# Patient Record
Sex: Male | Born: 1955 | Hispanic: Yes | Marital: Married | Smoking: Never smoker
Health system: Southern US, Community
[De-identification: ages and names within clinical notes are randomized; demographics above are authoritative.]

## PROBLEM LIST (undated history)

## (undated) DIAGNOSIS — Z889 Allergy status to unspecified drugs, medicaments and biological substances status: Secondary | ICD-10-CM

---

## 2014-05-19 ENCOUNTER — Encounter (HOSPITAL_COMMUNITY): Admission: EM | Disposition: A | Discharge status: Home Health | Payer: Self-pay | Source: Home / Self Care

## 2014-05-19 ENCOUNTER — Encounter (HOSPITAL_BASED_OUTPATIENT_CLINIC_OR_DEPARTMENT_OTHER): Payer: Self-pay | Admitting: *Deleted

## 2014-05-19 ENCOUNTER — Other Ambulatory Visit (HOSPITAL_COMMUNITY): Payer: Self-pay

## 2014-05-19 ENCOUNTER — Emergency Department (HOSPITAL_BASED_OUTPATIENT_CLINIC_OR_DEPARTMENT_OTHER): Payer: BLUE CROSS/BLUE SHIELD

## 2014-05-19 ENCOUNTER — Inpatient Hospital Stay (HOSPITAL_COMMUNITY): Payer: BLUE CROSS/BLUE SHIELD | Admitting: Anesthesiology

## 2014-05-19 ENCOUNTER — Inpatient Hospital Stay (HOSPITAL_BASED_OUTPATIENT_CLINIC_OR_DEPARTMENT_OTHER)
Admission: EM | Admit: 2014-05-19 | Discharge: 2014-05-23 | DRG: 329 | Disposition: A | Discharge status: Home Health | Payer: BLUE CROSS/BLUE SHIELD | Attending: General Surgery | Admitting: General Surgery

## 2014-05-19 DIAGNOSIS — D72829 Elevated white blood cell count, unspecified: Secondary | ICD-10-CM | POA: Diagnosis present

## 2014-05-19 DIAGNOSIS — K550 Acute vascular disorders of intestine: Secondary | ICD-10-CM | POA: Diagnosis present

## 2014-05-19 DIAGNOSIS — K420 Umbilical hernia with obstruction, without gangrene: Principal | ICD-10-CM | POA: Diagnosis present

## 2014-05-19 DIAGNOSIS — K56609 Unspecified intestinal obstruction, unspecified as to partial versus complete obstruction: Secondary | ICD-10-CM

## 2014-05-19 DIAGNOSIS — K9189 Other postprocedural complications and disorders of digestive system: Secondary | ICD-10-CM

## 2014-05-19 DIAGNOSIS — K567 Ileus, unspecified: Secondary | ICD-10-CM

## 2014-05-19 HISTORY — PX: UMBILICAL HERNIA REPAIR: SHX196

## 2014-05-19 LAB — URINALYSIS, ROUTINE W REFLEX MICROSCOPIC
Bilirubin Urine: NEGATIVE
GLUCOSE, UA: NEGATIVE mg/dL
HGB URINE DIPSTICK: NEGATIVE
KETONES UR: 15 mg/dL — AB
Leukocytes, UA: NEGATIVE
Nitrite: NEGATIVE
PH: 7 (ref 5.0–8.0)
Protein, ur: NEGATIVE mg/dL
Specific Gravity, Urine: 1.029 (ref 1.005–1.030)
Urobilinogen, UA: 1 mg/dL (ref 0.0–1.0)

## 2014-05-19 LAB — COMPREHENSIVE METABOLIC PANEL
ALBUMIN: 4.3 g/dL (ref 3.5–5.0)
ALK PHOS: 67 U/L (ref 38–126)
ALT: 24 U/L (ref 17–63)
AST: 14 U/L — ABNORMAL LOW (ref 15–41)
Anion gap: 11 (ref 5–15)
BUN: 15 mg/dL (ref 6–20)
CO2: 27 mmol/L (ref 22–32)
Calcium: 9.5 mg/dL (ref 8.9–10.3)
Chloride: 102 mmol/L (ref 101–111)
Creatinine, Ser: 0.59 mg/dL — ABNORMAL LOW (ref 0.61–1.24)
GFR calc Af Amer: >60 mL/min (ref >60)
GFR calc non Af Amer: >60 mL/min (ref >60)
Glucose, Bld: 174 mg/dL — ABNORMAL HIGH (ref 70–99)
POTASSIUM: 3.5 mmol/L (ref 3.5–5.1)
SODIUM: 140 mmol/L (ref 135–145)
TOTAL PROTEIN: 7.9 g/dL (ref 6.5–8.1)
Total Bilirubin: 0.9 mg/dL (ref 0.3–1.2)

## 2014-05-19 LAB — CBC WITH DIFFERENTIAL/PLATELET
Basophils Absolute: 0 10*3/uL (ref 0.0–0.1)
Basophils Relative: 0 % (ref 0–1)
EOS PCT: 0 % (ref 0–5)
Eosinophils Absolute: 0 10*3/uL (ref 0.0–0.7)
HEMATOCRIT: 47.4 % (ref 39.0–52.0)
HEMOGLOBIN: 16.6 g/dL (ref 13.0–17.0)
LYMPHS PCT: 7 % — AB (ref 12–46)
Lymphs Abs: 1 10*3/uL (ref 0.7–4.0)
MCH: 31.4 pg (ref 26.0–34.0)
MCHC: 35 g/dL (ref 30.0–36.0)
MCV: 89.6 fL (ref 78.0–100.0)
MONO ABS: 0.7 10*3/uL (ref 0.1–1.0)
Monocytes Relative: 5 % (ref 3–12)
Neutro Abs: 12.5 10*3/uL — ABNORMAL HIGH (ref 1.7–7.7)
Neutrophils Relative %: 88 % — ABNORMAL HIGH (ref 43–77)
Platelets: 310 10*3/uL (ref 150–400)
RBC: 5.29 MIL/uL (ref 4.22–5.81)
RDW: 12.4 % (ref 11.5–15.5)
WBC: 14.2 10*3/uL — ABNORMAL HIGH (ref 4.0–10.5)

## 2014-05-19 LAB — I-STAT CG4 LACTIC ACID, ED
LACTIC ACID, VENOUS: 1.24 mmol/L (ref 0.5–2.0)
LACTIC ACID, VENOUS: 1.16 mmol/L (ref 0.5–2.0)

## 2014-05-19 SURGERY — REPAIR, HERNIA, UMBILICAL, LAPAROSCOPIC
Anesthesia: General | Site: Abdomen

## 2014-05-19 MED ORDER — DEXAMETHASONE SODIUM PHOSPHATE 10 MG/ML IJ SOLN
INTRAMUSCULAR | Status: DC | PRN
  Administered 2014-05-19: 10 mg via INTRAVENOUS

## 2014-05-19 MED ORDER — MIDAZOLAM HCL 2 MG/2ML IJ SOLN
INTRAMUSCULAR | Status: AC
  Filled 2014-05-19: qty 2

## 2014-05-19 MED ORDER — PHENOL 1.4 % MT LIQD
1.0000 | OROMUCOSAL | Status: DC | PRN
  Administered 2014-05-20: 1 via OROMUCOSAL
  Filled 2014-05-19: qty 177

## 2014-05-19 MED ORDER — HYDROMORPHONE HCL 1 MG/ML IJ SOLN
1.0000 mg | Freq: Once | INTRAMUSCULAR | Status: AC
  Administered 2014-05-19: 1 mg via INTRAVENOUS
  Filled 2014-05-19: qty 1

## 2014-05-19 MED ORDER — FENTANYL CITRATE (PF) 100 MCG/2ML IJ SOLN: 25.0000 ug | INTRAMUSCULAR | Status: DC | PRN

## 2014-05-19 MED ORDER — CISATRACURIUM BESYLATE (PF) 10 MG/5ML IV SOLN
INTRAVENOUS | Status: DC | PRN
  Administered 2014-05-19: 2 mg via INTRAVENOUS
  Administered 2014-05-19: 8 mg via INTRAVENOUS

## 2014-05-19 MED ORDER — ACETAMINOPHEN 10 MG/ML IV SOLN
1000.0000 mg | Freq: Once | INTRAVENOUS | Status: AC
  Administered 2014-05-19: 1000 mg via INTRAVENOUS
  Filled 2014-05-19: qty 100

## 2014-05-19 MED ORDER — PROPOFOL 10 MG/ML IV BOLUS
INTRAVENOUS | Status: DC | PRN
  Administered 2014-05-19: 160 mg via INTRAVENOUS

## 2014-05-19 MED ORDER — NEOSTIGMINE METHYLSULFATE 10 MG/10ML IV SOLN
INTRAVENOUS | Status: DC | PRN
  Administered 2014-05-19: 3 mg via INTRAVENOUS

## 2014-05-19 MED ORDER — IOHEXOL 300 MG/ML  SOLN
100.0000 mL | Freq: Once | INTRAMUSCULAR | Status: AC | PRN
  Administered 2014-05-19: 100 mL via INTRAVENOUS

## 2014-05-19 MED ORDER — DEXAMETHASONE SODIUM PHOSPHATE 10 MG/ML IJ SOLN
INTRAMUSCULAR | Status: AC
  Filled 2014-05-19: qty 1

## 2014-05-19 MED ORDER — ENOXAPARIN SODIUM 40 MG/0.4ML ~~LOC~~ SOLN
40.0000 mg | SUBCUTANEOUS | Status: DC
  Administered 2014-05-20 – 2014-05-23 (×4): 40 mg via SUBCUTANEOUS
  Filled 2014-05-19 (×5): qty 0.4

## 2014-05-19 MED ORDER — BUPIVACAINE-EPINEPHRINE 0.25% -1:200000 IJ SOLN
INTRAMUSCULAR | Status: DC | PRN
  Administered 2014-05-19: 1 mL

## 2014-05-19 MED ORDER — GLYCOPYRROLATE 0.2 MG/ML IJ SOLN
INTRAMUSCULAR | Status: DC | PRN
  Administered 2014-05-19 (×2): 0.2 mg via INTRAVENOUS

## 2014-05-19 MED ORDER — MEPERIDINE HCL 50 MG/ML IJ SOLN: 6.2500 mg | INTRAMUSCULAR | Status: DC | PRN

## 2014-05-19 MED ORDER — CEFOTETAN DISODIUM 2 G IJ SOLR: 2.0000 g | Freq: Once | INTRAMUSCULAR | Status: DC

## 2014-05-19 MED ORDER — FENTANYL CITRATE (PF) 250 MCG/5ML IJ SOLN
INTRAMUSCULAR | Status: AC
  Filled 2014-05-19: qty 5

## 2014-05-19 MED ORDER — MIDAZOLAM HCL 5 MG/5ML IJ SOLN
INTRAMUSCULAR | Status: DC | PRN
  Administered 2014-05-19 (×2): 0.5 mg via INTRAVENOUS

## 2014-05-19 MED ORDER — CETYLPYRIDINIUM CHLORIDE 0.05 % MT LIQD
7.0000 mL | Freq: Two times a day (BID) | OROMUCOSAL | Status: DC
  Administered 2014-05-20 – 2014-05-22 (×4): 7 mL via OROMUCOSAL

## 2014-05-19 MED ORDER — HYDROMORPHONE HCL 1 MG/ML IJ SOLN: 0.5000 mg | INTRAMUSCULAR | Status: DC | PRN

## 2014-05-19 MED ORDER — SUCCINYLCHOLINE CHLORIDE 20 MG/ML IJ SOLN
INTRAMUSCULAR | Status: DC | PRN
  Administered 2014-05-19: 120 mg via INTRAVENOUS

## 2014-05-19 MED ORDER — BUPIVACAINE-EPINEPHRINE (PF) 0.25% -1:200000 IJ SOLN
INTRAMUSCULAR | Status: AC
Start: 2014-05-19 — End: 2014-05-19
  Filled 2014-05-19: qty 30

## 2014-05-19 MED ORDER — NEOSTIGMINE METHYLSULFATE 10 MG/10ML IV SOLN
INTRAVENOUS | Status: AC
  Filled 2014-05-19: qty 1

## 2014-05-19 MED ORDER — FENTANYL CITRATE (PF) 100 MCG/2ML IJ SOLN
INTRAMUSCULAR | Status: DC | PRN
  Administered 2014-05-19 (×2): 25 ug via INTRAVENOUS
  Administered 2014-05-19: 50 ug via INTRAVENOUS
  Administered 2014-05-19 (×2): 25 ug via INTRAVENOUS

## 2014-05-19 MED ORDER — HYDROMORPHONE HCL 1 MG/ML IJ SOLN
1.0000 mg | INTRAMUSCULAR | Status: DC | PRN
  Administered 2014-05-19 – 2014-05-21 (×6): 1 mg via INTRAVENOUS
  Filled 2014-05-19 (×6): qty 1

## 2014-05-19 MED ORDER — ONDANSETRON HCL 4 MG/2ML IJ SOLN
4.0000 mg | Freq: Once | INTRAMUSCULAR | Status: AC
  Administered 2014-05-19: 4 mg via INTRAVENOUS
  Filled 2014-05-19: qty 2

## 2014-05-19 MED ORDER — HYDROMORPHONE HCL 1 MG/ML IJ SOLN
0.5000 mg | Freq: Once | INTRAMUSCULAR | Status: AC
  Administered 2014-05-19: 0.5 mg via INTRAVENOUS
  Filled 2014-05-19: qty 1

## 2014-05-19 MED ORDER — ONDANSETRON HCL 4 MG/2ML IJ SOLN
4.0000 mg | Freq: Four times a day (QID) | INTRAMUSCULAR | Status: DC | PRN
  Administered 2014-05-19 (×2): 2 mg via INTRAVENOUS

## 2014-05-19 MED ORDER — CHLORHEXIDINE GLUCONATE 0.12 % MT SOLN
15.0000 mL | Freq: Two times a day (BID) | OROMUCOSAL | Status: DC
  Administered 2014-05-19 – 2014-05-22 (×5): 15 mL via OROMUCOSAL
  Filled 2014-05-19 (×6): qty 15

## 2014-05-19 MED ORDER — ONDANSETRON HCL 4 MG/2ML IJ SOLN
INTRAMUSCULAR | Status: AC
Start: 2014-05-19 — End: 2014-05-19
  Filled 2014-05-19: qty 2

## 2014-05-19 MED ORDER — LIDOCAINE HCL (CARDIAC) 20 MG/ML IV SOLN
INTRAVENOUS | Status: AC
  Filled 2014-05-19: qty 5

## 2014-05-19 MED ORDER — ONDANSETRON HCL 4 MG/2ML IJ SOLN: 4.0000 mg | Freq: Four times a day (QID) | INTRAMUSCULAR | Status: DC | PRN

## 2014-05-19 MED ORDER — CISATRACURIUM BESYLATE 20 MG/10ML IV SOLN
INTRAVENOUS | Status: AC
Start: 2014-05-19 — End: 2014-05-19
  Filled 2014-05-19: qty 10

## 2014-05-19 MED ORDER — HYDROMORPHONE HCL 1 MG/ML IJ SOLN
1.0000 mg | Freq: Once | INTRAMUSCULAR | Status: AC | PRN
  Administered 2014-05-19: 1 mg via INTRAVENOUS
  Filled 2014-05-19: qty 1

## 2014-05-19 MED ORDER — LIDOCAINE HCL (CARDIAC) 20 MG/ML IV SOLN
INTRAVENOUS | Status: DC | PRN
  Administered 2014-05-19: 75 mg via INTRAVENOUS

## 2014-05-19 MED ORDER — PROMETHAZINE HCL 25 MG/ML IJ SOLN: 6.2500 mg | INTRAMUSCULAR | Status: DC | PRN

## 2014-05-19 MED ORDER — POTASSIUM CHLORIDE IN NACL 20-0.9 MEQ/L-% IV SOLN
INTRAVENOUS | Status: DC
  Filled 2014-05-19 (×13): qty 1000

## 2014-05-19 MED ORDER — 0.9 % SODIUM CHLORIDE (POUR BTL) OPTIME
TOPICAL | Status: DC | PRN
  Administered 2014-05-19: 2000 mL

## 2014-05-19 MED ORDER — HYDROMORPHONE HCL 2 MG/ML IJ SOLN
INTRAMUSCULAR | Status: AC
  Filled 2014-05-19: qty 1

## 2014-05-19 MED ORDER — PROPOFOL 10 MG/ML IV BOLUS
INTRAVENOUS | Status: AC
Start: 2014-05-19 — End: 2014-05-19
  Filled 2014-05-19: qty 20

## 2014-05-19 MED ORDER — MORPHINE SULFATE 2 MG/ML IJ SOLN: 1.0000 mg | INTRAMUSCULAR | Status: DC | PRN

## 2014-05-19 MED ORDER — SODIUM CHLORIDE 0.9 % IV BOLUS (SEPSIS)
1000.0000 mL | Freq: Once | INTRAVENOUS | Status: AC
  Administered 2014-05-19: 1000 mL via INTRAVENOUS

## 2014-05-19 MED ORDER — GLYCOPYRROLATE 0.2 MG/ML IJ SOLN
INTRAMUSCULAR | Status: AC
  Filled 2014-05-19: qty 2

## 2014-05-19 MED ORDER — LACTATED RINGERS IV SOLN
INTRAVENOUS | Status: DC
  Administered 2014-05-19 (×2): via INTRAVENOUS
  Administered 2014-05-19: 1000 mL via INTRAVENOUS

## 2014-05-19 MED ORDER — CEFOTETAN DISODIUM-DEXTROSE 2-2.08 GM-% IV SOLR
INTRAVENOUS | Status: AC
  Filled 2014-05-19: qty 50

## 2014-05-19 MED ORDER — CEFOTETAN DISODIUM 2 G IJ SOLR
2.0000 g | INTRAMUSCULAR | Status: AC
  Administered 2014-05-19: 2 g via INTRAVENOUS

## 2014-05-19 MED ORDER — DEXTROSE-NACL 5-0.9 % IV SOLN
INTRAVENOUS | Status: DC
  Administered 2014-05-19 – 2014-05-20 (×3): 100 mL/h via INTRAVENOUS
  Administered 2014-05-20: 20:00:00 via INTRAVENOUS
  Administered 2014-05-21: 100 mL/h via INTRAVENOUS
  Administered 2014-05-21: 06:00:00 via INTRAVENOUS
  Administered 2014-05-22: 100 mL/h via INTRAVENOUS
  Administered 2014-05-22: 03:00:00 via INTRAVENOUS

## 2014-05-19 MED ORDER — GLYCOPYRROLATE 0.2 MG/ML IJ SOLN
INTRAMUSCULAR | Status: AC
  Filled 2014-05-19: qty 3

## 2014-05-19 SURGICAL SUPPLY — 62 items
APPLIER CLIP 5 13 M/L LIGAMAX5 (MISCELLANEOUS) ×4
APPLIER CLIP ROT 10 11.4 M/L (STAPLE) ×4
BAG URINE DRAINAGE (UROLOGICAL SUPPLIES) IMPLANT
BAG URO CATCHER STRL LF (DRAPE) IMPLANT
BLADE EXTENDED COATED 6.5IN (ELECTRODE) ×4 IMPLANT
BLADE HEX COATED 2.75 (ELECTRODE) ×4 IMPLANT
CABLE HIGH FREQUENCY MONO STRZ (ELECTRODE) ×4 IMPLANT
CATH FOLEY SILVER 30CC 28FR (CATHETERS) IMPLANT
CELLS DAT CNTRL 66122 CELL SVR (MISCELLANEOUS) IMPLANT
CLIP APPLIE 5 13 M/L LIGAMAX5 (MISCELLANEOUS) ×2 IMPLANT
CLIP APPLIE ROT 10 11.4 M/L (STAPLE) ×2 IMPLANT
DECANTER SPIKE VIAL GLASS SM (MISCELLANEOUS) ×4 IMPLANT
DRAIN CHANNEL 19F RND (DRAIN) ×4 IMPLANT
DRAPE LAPAROSCOPIC ABDOMINAL (DRAPES) ×4 IMPLANT
ELECT REM PT RETURN 9FT ADLT (ELECTROSURGICAL) ×4
ELECTRODE REM PT RTRN 9FT ADLT (ELECTROSURGICAL) ×2 IMPLANT
FILTER SMOKE EVAC LAPAROSHD (FILTER) IMPLANT
GAUZE SPONGE 4X4 12PLY STRL (GAUZE/BANDAGES/DRESSINGS) ×4 IMPLANT
GLOVE ECLIPSE 8.0 STRL XLNG CF (GLOVE) ×8 IMPLANT
GLOVE INDICATOR 8.0 STRL GRN (GLOVE) ×8 IMPLANT
GOWN STRL REUS W/TWL LRG LVL3 (GOWN DISPOSABLE) ×4 IMPLANT
GOWN STRL REUS W/TWL XL LVL3 (GOWN DISPOSABLE) ×8 IMPLANT
LEGGING LITHOTOMY PAIR STRL (DRAPES) IMPLANT
LIGASURE IMPACT 36 18CM CVD LR (INSTRUMENTS) IMPLANT
NS IRRIG 1000ML POUR BTL (IV SOLUTION) ×4 IMPLANT
PACK COLON (CUSTOM PROCEDURE TRAY) ×4 IMPLANT
PAD TELFA 2X3 NADH STRL (GAUZE/BANDAGES/DRESSINGS) ×12 IMPLANT
PAIN PUMP ON-Q 270MLX5ML 5IN (MISCELLANEOUS) IMPLANT
PUMP PAIN ON-Q (MISCELLANEOUS) ×4 IMPLANT
RELOAD PROXIMATE 75MM BLUE (ENDOMECHANICALS) ×8 IMPLANT
RTRCTR WOUND ALEXIS 18CM MED (MISCELLANEOUS)
SCISSORS LAP 5X35 DISP (ENDOMECHANICALS) ×4 IMPLANT
SEALER TISSUE G2 CVD JAW 35 (ENDOMECHANICALS) IMPLANT
SEALER TISSUE G2 CVD JAW 45CM (ENDOMECHANICALS)
SET IRRIG TUBING LAPAROSCOPIC (IRRIGATION / IRRIGATOR) ×4 IMPLANT
SHEARS HARMONIC ACE PLUS 36CM (ENDOMECHANICALS) IMPLANT
SOLUTION ANTI FOG 6CC (MISCELLANEOUS) ×4 IMPLANT
STAPLER PROXIMATE 75MM BLUE (STAPLE) ×4 IMPLANT
STAPLER VISISTAT 35W (STAPLE) ×4 IMPLANT
SUT PDS AB 1 CTX 36 (SUTURE) ×8 IMPLANT
SUT PDS AB 1 TP1 96 (SUTURE) IMPLANT
SUT PROLENE 2 0 KS (SUTURE) IMPLANT
SUT SILK 2 0 (SUTURE) ×2
SUT SILK 2 0 SH CR/8 (SUTURE) ×8 IMPLANT
SUT SILK 2-0 18XBRD TIE 12 (SUTURE) ×2 IMPLANT
SUT SILK 3 0 (SUTURE) ×2
SUT SILK 3 0 SH CR/8 (SUTURE) ×4 IMPLANT
SUT SILK 3-0 18XBRD TIE 12 (SUTURE) ×2 IMPLANT
SUT VIC AB 2-0 SH 18 (SUTURE) ×8 IMPLANT
SUT VICRYL 2 0 18  UND BR (SUTURE) ×4
SUT VICRYL 2 0 18 UND BR (SUTURE) ×4 IMPLANT
SYR 30ML LL (SYRINGE) IMPLANT
SYRINGE IRR TOOMEY STRL 70CC (SYRINGE) IMPLANT
SYS LAPSCP GELPORT 120MM (MISCELLANEOUS)
SYSTEM LAPSCP GELPORT 120MM (MISCELLANEOUS) IMPLANT
TRAY FOLEY W/METER SILVER 16FR (SET/KITS/TRAYS/PACK) ×4 IMPLANT
TROCAR CANNULA 5X100MM C0Q10 (TROCAR) ×4 IMPLANT
TROCAR KII 11MM C0R21THR OPT (TROCAR) ×4 IMPLANT
TROCAR KII 11MM THR (TROCAR) IMPLANT
TROCAR KII 5X100MM (TROCAR) ×8 IMPLANT
TUBING FILTER THERMOFLATOR (ELECTROSURGICAL) ×4 IMPLANT
YANKAUER SUCT BULB TIP NO VENT (SUCTIONS) ×4 IMPLANT

## 2014-05-19 NOTE — Transfer of Care (Signed)
Immediate Anesthesia Transfer of Care Note  Patient: Ernest [name]  Procedure(s) Performed: Procedure(s): DIAGNOSTIC LAPAROSCOPY, SMALL BOWEL RESECTION WITH PRIMARY INCARCERATED UMBILICAL HERNIA REPAIR (N/A)  Patient Location: PACU  Anesthesia Type:General  Level of Consciousness: awake, oriented, pateint uncooperative, lethargic and responds to stimulation  Airway & Oxygen Therapy: Patient Spontanous Breathing and Patient connected to face mask oxygen  Post-op Assessment: Report given to RN, Post -op Vital signs reviewed and stable and Patient moving all extremities  Post vital signs: Reviewed and stable  Last Vitals:  Filed Vitals:   05/19/14 1230  BP: 117/66  Pulse: 67  Temp:   Resp: 14    Complications: No apparent anesthesia complications

## 2014-05-19 NOTE — Anesthesia Postprocedure Evaluation (Signed)
  Anesthesia Post-op Note  Patient: Ernest [name]  Procedure(s) Performed: Procedure(s): DIAGNOSTIC LAPAROSCOPY, SMALL BOWEL RESECTION WITH PRIMARY INCARCERATED UMBILICAL HERNIA REPAIR (N/A)  Patient Location: PACU  Anesthesia Type:General  Level of Consciousness: awake, alert  and oriented  Airway and Oxygen Therapy: Patient Spontanous Breathing and Patient connected to nasal cannula oxygen  Post-op Pain: none  Post-op Assessment: Post-op Vital signs reviewed, Patient's Cardiovascular Status Stable, Respiratory Function Stable, Patent Airway and No signs of Nausea or vomiting  Post-op Vital Signs: Reviewed and stable  Last Vitals:  Filed Vitals:   05/19/14 1230  BP: 117/66  Pulse: 67  Temp: 36.7 C  Resp: 14    Complications: No apparent anesthesia complications

## 2014-05-19 NOTE — ED Notes (Signed)
abd pain around umbilical onset yesterday  Red and swollen

## 2014-05-19 NOTE — Anesthesia Preprocedure Evaluation (Addendum)
Anesthesia Evaluation  Patient identified by MRN, date of birth, ID band Patient awake    Reviewed: Allergy & Precautions, NPO status , Patient's Chart, lab work & pertinent test results, reviewed documented beta blocker date and time   Airway Mallampati: III   Neck ROM: Full    Dental  (+) Dental Advisory Given, Teeth Intact   Pulmonary neg pulmonary ROS,  breath sounds clear to auscultation        Cardiovascular negative cardio ROS  Rhythm:Regular     Neuro/Psych negative neurological ROS  negative psych ROS   GI/Hepatic Neg liver ROS, SBO incarcerated umbilical hernia   Endo/Other  negative endocrine ROS  Renal/GU      Musculoskeletal   Abdominal (+)  Abdomen: soft.    Peds  Hematology 14/47 WBC 14.2K   Anesthesia Other Findings   Reproductive/Obstetrics                           Anesthesia Physical Anesthesia Plan  ASA: II  Anesthesia Plan: General   Post-op Pain Management:    Induction: Rapid sequence, Intravenous and Cricoid pressure planned  Airway Management Planned: Oral ETT  Additional Equipment:   Intra-op Plan:   Post-operative Plan: Extubation in OR  Informed Consent: I have reviewed the patients History and Physical, chart, labs and discussed the procedure including the risks, benefits and alternatives for the proposed anesthesia with the patient or authorized representative who has indicated his/her understanding and acceptance.     Plan Discussed with:   Anesthesia Plan Comments:         Anesthesia Quick Evaluation

## 2014-05-19 NOTE — ED Notes (Signed)
Report given to Janett Billow RN at [facility] ED.

## 2014-05-19 NOTE — Op Note (Signed)
05/19/2014  12:12 PM  PATIENT:  Ernest [name]  59 y.o. male  PRE-OPERATIVE DIAGNOSIS:  incarcerated umbilical hernia possible small bowel obstruction   POST-OPERATIVE DIAGNOSIS:  Strangulate umbilical hernia repair with small bowel, necrotic bowel  PROCEDURE:  Procedure(s): DIAGNOSTIC LAPAROSCOPY, SMALL BOWEL RESECTION WITH PRIMARY INCARCERATED UMBILICAL HERNIA REPAIR (N/A)  SURGEON:  Surgeon(s) and Role:    * Ralene Ok, MD - Primary  ASSISTANTS: Servando Snare, RNFA   ANESTHESIA:   local and general  EBL:10cc   Total I/O In: 1000 [I.V.:1000] Out: 8786 [Urine:300; Other:700; Blood:25]  BLOOD ADMINISTERED:none  DRAINS: none   LOCAL MEDICATIONS USED:  BUPIVICAINE   SPECIMEN:  Source of Specimen:  Small bowel  DISPOSITION OF SPECIMEN:  PATHOLOGY  COUNTS:  YES  TOURNIQUET:  * No tourniquets in log *  DICTATION: .Dragon Dictation After the patient was consented he was taken back to the operating room and placed in the supine position with bilateral SCDs in place. The patient was prepped and draped in the usual sterile fashion. After appropriate antibiotic were confirmed, a timeout was called all facts verified.  A Veress needle technique was used to insufflate the abdomen to 15 mmHg in the left subcostal margin. Subsequent to this a 5 mm trocar and camera were then placed intra-abdominally. There is no injury to any intra-abdominal organs. At this time it was evident there was an incarcerated umbilical hernia consisting of small bowel. A subsequent 5 mm trocar was then placed in the left lower quadrant under direct visualization.  With gentle traction the incarcerated hernia was reduced. The small bowel that was within the hernia was dusky appearing. At this time secondary to the ischemic looking bowel I proceeded to create a midline incision at the umbilical hernia. This was incised using electrocautery used to maintain hemostasis. This time the portion of small bowel that was  ischemic was brought up through the wound.  Sterile towels were used to surround the incision site. At this time 75 GIA stapler blue, was used to transect the proximal and distal portion of the small bowel. The mesentery was ligated with 2-0 silk's.  At this time an enterotomy was made at each corner of the staple line. A 75 GIA stapler was used to create a common anastomosis and the standard fashion. The enterotomy was then closed using another firing of the 75 GIA stapler. The staple lines were offset. An apex stitch was placed using a 2-0 silk. One corner of the staple line appeared to be bleeding and this was sutured to imbricate the staple line with a 2-0 silk 1. At this time the mesentery was reapproximated using figure-of-eight stitches using 2-0 silk. The bowel was then placed back into the abdomen.  At this time our gloves were changed and new towels were placed around the umbilical incision site. Coker's were used to elevate the fascia. The apices of the fascia were then reapproximated using 0 Novafil sutures in a figure-of-eight fashion. At this time insufflation was begun and a laparoscope was again inserted into the abdomen. The remaining midportion of the fascia was then reapproximated using 0 Novafils via a Endo Close device and interrupted fashion. Omentum was brought over the anastomosis which appeared healthy. At this time the insufflation was evacuated. Trochars were removed. The incision site at the trochars were then reapproximated using a 4-0 Monocryl subcuticular fashion. The umbilical incision was left open and packed with saline soaked gauze. The wound was dressed with 4 x 4's and tape. The  trocar sites were dressed with Steri-Strips gauze and tape. The patient was awakened from general anesthesia and taken to the recovery room in stable condition.  Once this was done the bowel was placed back into the abdominal cavity.  PLAN OF CARE: Already admitted as inpatient  PATIENT  DISPOSITION:  PACU - hemodynamically stable.   Delay start of Pharmacological VTE agent (>24hrs) due to surgical blood loss or risk of bleeding: yes

## 2014-05-19 NOTE — ED Provider Notes (Signed)
CSN: [id number]     Arrival date & time 05/19/14  0447 History   First MD Initiated Contact with Patient 05/19/14 0500     Chief Complaint  Patient presents with  . Abdominal Pain     (Consider location/radiation/quality/duration/timing/severity/associated sxs/prior Treatment) HPI  This is a 59 year old male with no reported past medical history presents with abdominal pain. Patient reports onset of pain at approximately 3 PM yesterday. It occurred after eating. Abdomen has become progressively more swollen and pain has increased. Pain is currently 10 out of 10. It is sharp and nonradiating. He has not taken anything for the pain. Last normal bowel movement was yesterday. Patient denies any vomiting or diarrhea. Denies any fevers. No history of similar pain in the past.  History reviewed. No pertinent past medical history. History reviewed. No pertinent past surgical history. History reviewed. No pertinent family history. History  Substance Use Topics  . Smoking status: Never Smoker   . Smokeless tobacco: Not on file  . Alcohol Use: No    Review of Systems  Constitutional: Negative.  Negative for fever.  Respiratory: Negative.  Negative for chest tightness and shortness of breath.   Cardiovascular: Negative.  Negative for chest pain.  Gastrointestinal: Positive for abdominal pain. Negative for nausea, vomiting and diarrhea.  Genitourinary: Negative.  Negative for dysuria.  Musculoskeletal: Negative for back pain.  Skin: Positive for color change. Negative for rash.  All other systems reviewed and are negative.     Allergies  Review of patient's allergies indicates not on file.  Home Medications   Prior to Admission medications   Not on File   BP 141/82 mmHg  Pulse 78  Temp(Src) 98.5 F (36.9 C) (Oral)  Resp 20  Ht 5\' 7"  (1.702 m)  Wt 200 lb (90.719 kg)  BMI 31.32 kg/m2  SpO2 98% Physical Exam  Constitutional: He is oriented to person, place, and time. He appears  well-developed and well-nourished. No distress.  HENT:  Head: Normocephalic and atraumatic.  Cardiovascular: Normal rate, regular rhythm and normal heart sounds.   No murmur heard. Pulmonary/Chest: Effort normal and breath sounds normal. No respiratory distress. He has no wheezes.  Abdominal: Soft. There is tenderness. There is no rebound.  Hyperactive bowel sounds, tense umbilical hernia approximately 3 cm in diameter, overlying redness and tenderness to palpation, unreducible  Musculoskeletal: He exhibits no edema.  Neurological: He is alert and oriented to person, place, and time.  Skin: Skin is warm and dry.  Psychiatric: He has a normal mood and affect.  Nursing note and vitals reviewed.   ED Course  Procedures (including critical care time) Labs Review Labs Reviewed  URINALYSIS, ROUTINE W REFLEX MICROSCOPIC - Abnormal; Notable for the following:    Ketones, ur 15 (*)    All other components within normal limits  CBC WITH DIFFERENTIAL/PLATELET - Abnormal; Notable for the following:    WBC 14.2 (*)    Neutrophils Relative % 88 (*)    Neutro Abs 12.5 (*)    Lymphocytes Relative 7 (*)    All other components within normal limits  COMPREHENSIVE METABOLIC PANEL - Abnormal; Notable for the following:    Glucose, Bld 174 (*)    Creatinine, Ser 0.59 (*)    AST 14 (*)    All other components within normal limits  I-STAT CG4 LACTIC ACID, ED    Imaging Review Ct Abdomen Pelvis W Contrast  05/19/2014   CLINICAL DATA:  Abdominal pain, periumbilical.  A onset yesterday.  EXAM: CT ABDOMEN AND PELVIS WITH CONTRAST  TECHNIQUE: Multidetector CT imaging of the abdomen and pelvis was performed using the standard protocol following bolus administration of intravenous contrast.  CONTRAST:  122mL OMNIPAQUE IOHEXOL 300 MG/ML  SOLN  COMPARISON:  None.  FINDINGS: There is a small bowel obstruction in an umbilical hernia. The abnormal small bowel dilatation extends into the hernia. The small bowel  exiting the umbilical hernia is decompressed. There is no extraluminal air. There is no ascites.  There are normal appearances of the liver, spleen, pancreas, adrenals and kidneys. The abdominal aorta is normal in caliber. Colon is unremarkable.  There is no significant abnormality in the lower chest. Moderate lumbar degenerative disc changes are present, with a grade 1 retrolisthesis at L4-5. There also is severe lower lumbar facet arthritis from L4 through the sacrum.  IMPRESSION: *Small bowel obstruction within an umbilical hernia. *Incidentally noted lumbar degenerative disc and facet changes with grade 1 retrolisthesis at L4-5.   Electronically Signed   By: Andreas Newport M.D.   On: 05/19/2014 06:24     EKG Interpretation None      MDM   Final diagnoses:  Incarcerated umbilical hernia  SBO (small bowel obstruction)    Patient presents with abdominal pain. Denies obstructive symptoms. Vital signs stable. Exam notable for an incarcerated umbilical hernia. Attempts x 2 to reduce with adequate analgesia failed. Patient was made NPO. Given fluids. Basic labwork obtained. Lactic acid is normal. Patient does have leukocytosis with a left shift. CT scan shows umbilical hernia containing small bowel and causing obstruction. Clinically, patient does not have any obstructive symptoms. Discussed with Dr. Harlow Asa, [facility] surgery. Will transfer patient to [facility] ED. Patient will be evaluated by Dr. Rosendo Gros upon arrival.  Given that patient does not have any obstructive symptoms, NG tube has not been placed. This was discussed with general surgery and agreed upon.  Spoke with Dr. Lita Mains at [facility] who is expecting patient.    Merryl Hacker, MD 05/19/14 [phone]

## 2014-05-19 NOTE — Anesthesia Procedure Notes (Signed)
Procedure Name: Intubation Date/Time: 05/19/2014 10:50 AM Performed by: Ofilia Neas Pre-anesthesia Checklist: Patient identified, Emergency Drugs available, Suction available, Patient being monitored and Timeout performed Patient Re-evaluated:Patient Re-evaluated prior to inductionOxygen Delivery Method: Circle system utilized Preoxygenation: Pre-oxygenation with 100% oxygen Intubation Type: IV induction, Rapid sequence and Cricoid Pressure applied Laryngoscope Size: Mac and 4 Grade View: Grade III Tube type: Oral Tube size: 7.5 mm Number of attempts: 2 Airway Equipment and Method: Stylet Secured at: 22 cm Tube secured with: Tape Dental Injury: Teeth and Oropharynx as per pre-operative assessment  Difficulty Due To: Difficulty was anticipated, Difficult Airway- due to large tongue, Difficult Airway- due to reduced neck mobility, Difficult Airway- due to anterior larynx, Difficult Airway- due to limited oral opening and Difficult Airway- due to dentition Future Recommendations: Recommend- induction with short-acting agent, and alternative techniques readily available

## 2014-05-19 NOTE — ED Notes (Signed)
[facility] here to transport pt to [facility]. Care turned over to [facility] staff. Report given to [facility]

## 2014-05-19 NOTE — ED Provider Notes (Signed)
9:00 AM  Pt is a 59 y.o. male who was transferred from [facility] for concerns for incarcerated umbilical hernia. Currently hemodynamically stable. He has a nonsurgical abdomen.  I am unable to reduce patient's hernia. Surgery agreed to see the patient in the emergency department. Will contact surgery to have them evaluate patient in the ED and admit.  [place], DO 05/19/14 1521

## 2014-05-19 NOTE — ED Notes (Signed)
C/o abd pain onset yesterday around umbilical,  Red and swollen

## 2014-05-19 NOTE — ED Notes (Signed)
Ernest [name] OR transport pt to OR.

## 2014-05-19 NOTE — H&P (Signed)
Ernest [name] is an 59 y.o. male.   Chief Complaint: Umbilical pain HPI: 59 y/o with onset of redness and pain umbilical area, 14/78 pain on presentation to the ED at [facility].  LAST BM yesterday.  No nausea or vomiting, just pain.  He had no medical issues and no surgery in the past.  He was fine till yesterday. Has not eaten since about 1 PM yesterday.  Work up in [place] shows he is afebrile, VSS.  WBC is up 14.2 with left shift CT scan shows:   Small bowel obstruction within an umbilical hernia. Incidentally noted lumbar degenerative disc and facet changes with grade 1 retrolisthesis at L4-5.    History reviewed. No pertinent past medical history. No PE since he was a child  To see PCP for first time later this year.  History reviewed. No pertinent past surgical history.   He drank allot of beer in the past, none for 2 years Drugs: none Tobacco:  Never Married, works two jobs.  History reviewed. No pertinent family history. Social History:  reports that he has never smoked. He does not have any smokeless tobacco history on file. He reports that he does not drink alcohol or use illicit drugs.  Allergies: Not on File  Prior to Admission medications   Not on File     Results for orders placed or performed during the hospital encounter of 05/19/14 (from the past 48 hour(s))  Urinalysis, Routine w reflex microscopic     Status: Abnormal   Collection Time: 05/19/14  5:10 AM  Result Value Ref Range   Color, Urine YELLOW YELLOW   APPearance CLEAR CLEAR   Specific Gravity, Urine 1.029 1.005 - 1.030   pH 7.0 5.0 - 8.0   Glucose, UA NEGATIVE NEGATIVE mg/dL   Hgb urine dipstick NEGATIVE NEGATIVE   Bilirubin Urine NEGATIVE NEGATIVE   Ketones, ur 15 (A) NEGATIVE mg/dL   Protein, ur NEGATIVE NEGATIVE mg/dL   Urobilinogen, UA 1.0 0.0 - 1.0 mg/dL   Nitrite NEGATIVE NEGATIVE   Leukocytes, UA NEGATIVE NEGATIVE    Comment: MICROSCOPIC NOT DONE ON URINES WITH NEGATIVE PROTEIN, BLOOD, LEUKOCYTES,  NITRITE, OR GLUCOSE <1000 mg/dL.  CBC with Differential     Status: Abnormal   Collection Time: 05/19/14  5:20 AM  Result Value Ref Range   WBC 14.2 (H) 4.0 - 10.5 K/uL   RBC 5.29 4.22 - 5.81 MIL/uL   Hemoglobin 16.6 13.0 - 17.0 g/dL   HCT 47.4 39.0 - 52.0 %   MCV 89.6 78.0 - 100.0 fL   MCH 31.4 26.0 - 34.0 pg   MCHC 35.0 30.0 - 36.0 g/dL   RDW 12.4 11.5 - 15.5 %   Platelets 310 150 - 400 K/uL   Neutrophils Relative % 88 (H) 43 - 77 %   Neutro Abs 12.5 (H) 1.7 - 7.7 K/uL   Lymphocytes Relative 7 (L) 12 - 46 %   Lymphs Abs 1.0 0.7 - 4.0 K/uL   Monocytes Relative 5 3 - 12 %   Monocytes Absolute 0.7 0.1 - 1.0 K/uL   Eosinophils Relative 0 0 - 5 %   Eosinophils Absolute 0.0 0.0 - 0.7 K/uL   Basophils Relative 0 0 - 1 %   Basophils Absolute 0.0 0.0 - 0.1 K/uL  Comprehensive metabolic panel     Status: Abnormal   Collection Time: 05/19/14  5:20 AM  Result Value Ref Range   Sodium 140 135 - 145 mmol/L   Potassium 3.5 3.5 -  5.1 mmol/L   Chloride 102 101 - 111 mmol/L   CO2 27 22 - 32 mmol/L   Glucose, Bld 174 (H) 70 - 99 mg/dL   BUN 15 6 - 20 mg/dL   Creatinine, Ser 0.59 (L) 0.61 - 1.24 mg/dL   Calcium 9.5 8.9 - 10.3 mg/dL   Total Protein 7.9 6.5 - 8.1 g/dL   Albumin 4.3 3.5 - 5.0 g/dL   AST 14 (L) 15 - 41 U/L   ALT 24 17 - 63 U/L   Alkaline Phosphatase 67 38 - 126 U/L   Total Bilirubin 0.9 0.3 - 1.2 mg/dL   GFR calc non Af Amer >60 >60 mL/min   GFR calc Af Amer >60 >60 mL/min    Comment: (NOTE) The eGFR has been calculated using the CKD EPI equation. This calculation has not been validated in all clinical situations. eGFR's persistently <90 mL/min signify possible Chronic Kidney Disease.    Anion gap 11 5 - 15  I-Stat CG4 Lactic Acid, ED     Status: None   Collection Time: 05/19/14  5:28 AM  Result Value Ref Range   Lactic Acid, Venous 1.24 0.5 - 2.0 mmol/L   Ct Abdomen Pelvis W Contrast  05/19/2014   CLINICAL DATA:  Abdominal pain, periumbilical.  A onset yesterday.   EXAM: CT ABDOMEN AND PELVIS WITH CONTRAST  TECHNIQUE: Multidetector CT imaging of the abdomen and pelvis was performed using the standard protocol following bolus administration of intravenous contrast.  CONTRAST:  131m OMNIPAQUE IOHEXOL 300 MG/ML  SOLN  COMPARISON:  None.  FINDINGS: There is a small bowel obstruction in an umbilical hernia. The abnormal small bowel dilatation extends into the hernia. The small bowel exiting the umbilical hernia is decompressed. There is no extraluminal air. There is no ascites.  There are normal appearances of the liver, spleen, pancreas, adrenals and kidneys. The abdominal aorta is normal in caliber. Colon is unremarkable.  There is no significant abnormality in the lower chest. Moderate lumbar degenerative disc changes are present, with a grade 1 retrolisthesis at L4-5. There also is severe lower lumbar facet arthritis from L4 through the sacrum.  IMPRESSION: *Small bowel obstruction within an umbilical hernia. *Incidentally noted lumbar degenerative disc and facet changes with grade 1 retrolisthesis at L4-5.   Electronically Signed   By: DAndreas NewportM.D.   On: 05/19/2014 06:24    Review of Systems  Constitutional: Negative.   HENT: Negative.   Eyes: Negative.   Respiratory: Negative.   Cardiovascular: Negative.   Gastrointestinal: Positive for heartburn (occasional) and abdominal pain (10/10 on presentation to ED, better now with dilaudid). Negative for nausea, vomiting, diarrhea, constipation, blood in stool and melena.  Genitourinary: Negative.   Musculoskeletal: Negative.   Skin: Positive for rash (umbilicus started getting red yesterday). Negative for itching.  Neurological: Negative.   Endo/Heme/Allergies: Negative.   Psychiatric/Behavioral: Negative.     Blood pressure 125/72, pulse 72, temperature 97.7 F (36.5 C), temperature source Oral, resp. rate 18, height _0  (1.702 m), weight 90.719 kg (200 lb), SpO2 91 %. Physical Exam   Constitutional: He is oriented to person, place, and time. He appears well-developed and well-nourished. No distress.  HENT:  Head: Normocephalic and atraumatic.  Nose: Nose normal.  Eyes: Conjunctivae and EOM are normal. Right eye exhibits no discharge. Left eye exhibits no discharge. No scleral icterus.  Neck: Normal range of motion. Neck supple. No JVD present. No tracheal deviation present. No thyromegaly present.  Cardiovascular: Normal rate,  regular rhythm, normal heart sounds and intact distal pulses.   No murmur heard. Respiratory: Effort normal and breath sounds normal. No respiratory distress. He has no wheezes. He has no rales. He exhibits no tenderness.  GI: He exhibits no mass. There is tenderness. There is no rebound and no guarding.  Umbilicus 3.5 cm area of erythema, 6 cm area around umbilicus, hard and indurated.  Pain much better now that he has had pain med.  No bowel sounds  Musculoskeletal: He exhibits edema. He exhibits no tenderness.  Lymphadenopathy:    He has no cervical adenopathy.  Neurological: He is alert and oriented to person, place, and time. No cranial nerve deficit.  Skin: Skin is warm and dry. No rash noted. He is not diaphoretic. No erythema. No pallor.  Psychiatric: He has a normal mood and affect. His behavior is normal. Judgment and thought content normal.     Assessment/Plan 1. Incarcerated Umbilical hernia 2.  Remote history of ETOH use  Plan:  He is for surgery for repair of incarcerated umbilical hernia, possible small bowel resection.  Ernest [name] 05/19/2014, 9:15 AM

## 2014-05-19 NOTE — ED Notes (Signed)
Bed: EZ66 Expected date:  Expected time:  Means of arrival:  Comments: Incarcerated hernia from [facility]

## 2014-05-19 NOTE — ED Notes (Signed)
Pt going to [facility]. Rosana Fret at [facility] ED aware and will inform Occupational psychologist. Report given to Kateri Plummer at [facility] ED.

## 2014-05-20 ENCOUNTER — Inpatient Hospital Stay (HOSPITAL_COMMUNITY): Payer: BLUE CROSS/BLUE SHIELD

## 2014-05-20 ENCOUNTER — Encounter (HOSPITAL_COMMUNITY): Payer: Self-pay | Admitting: General Surgery

## 2014-05-20 MED ORDER — DEXTROSE 5 % IV SOLN
2.0000 g | Freq: Two times a day (BID) | INTRAVENOUS | Status: DC
  Administered 2014-05-20 – 2014-05-22 (×5): 2 g via INTRAVENOUS
  Filled 2014-05-20 (×5): qty 2

## 2014-05-20 MED ORDER — LIP MEDEX EX OINT
TOPICAL_OINTMENT | CUTANEOUS | Status: AC
  Administered 2014-05-20: 21:00:00
  Filled 2014-05-20: qty 7

## 2014-05-20 NOTE — Care Management Note (Signed)
Case Management Note  Patient Details  Name: Ernest [name] MRN: [id number] Date of Birth: [birth year]  Subjective/Objective:                  DIAGNOSTIC LAPAROSCOPY, SMALL BOWEL RESECTION WITH PRIMARY INCARCERATED UMBILICAL HERNIA REPAIR   Action/Plan: Discharge planning  Expected Discharge Date:   (unknown)               Expected Discharge Plan:   Home/self care  In-House Referral:     Discharge planning Services     Post Acute Care Choice:    Choice offered to:     DME Arranged:    DME Agency:     HH Arranged:    [place] Agency:     Status of Service: complete    Medicare Important Message Given: N/A   Date Medicare IM Given:    Medicare IM give by:    Date Additional Medicare IM Given:    Additional Medicare Important Message give by:     If discussed at [place] of Stay Meetings, dates discussed:    Additional Comments:  Guadalupe Maple, RN 05/20/2014, 2:41 PM

## 2014-05-20 NOTE — Progress Notes (Signed)
1 Day Post-Op  Subjective: No complaint, NG won't flush with air.  Either end.  I don't know if it's crimped or what but I am having it removed and replaced.  Objective: Vital signs in last 24 hours: Temp:  [97.6 F (36.4 C)-98.9 F (37.2 C)] 98.3 F (36.8 C) (05/05 1000) Pulse Rate:  [66-110] 97 (05/05 1000) Resp:  [13-16] 16 (05/05 1000) BP: (87-131)/(55-82) 107/68 mmHg (05/05 1000) SpO2:  [90 %-100 %] 90 % (05/05 1000) Last BM Date: 05/19/14 (PTA) 700 from NG Afebrile VSS No labs Intake/Output from previous day: 05/04 0701 - 05/05 0700 In: 3681.7 [I.V.:3681.7] Out: 1875 [Urine:1150; Blood:25] Intake/Output this shift: Total I/O In: 425 [I.V.:425] Out: 150 [Urine:150]  General appearance: alert, cooperative, no distress and sore Resp: clear to auscultation bilaterally GI: very sore, NG not working, incision OK, no BS, no flatus so far.  Lab Results:   Recent Labs  05/19/14 0520  WBC 14.2*  HGB 16.6  HCT 47.4  PLT 310    BMET  Recent Labs  05/19/14 0520  NA 140  K 3.5  CL 102  CO2 27  GLUCOSE 174*  BUN 15  CREATININE 0.59*  CALCIUM 9.5   PT/INR No results for input(s): LABPROT, INR in the last 72 hours.   Recent Labs Lab 05/19/14 0520  AST 14*  ALT 24  ALKPHOS 67  BILITOT 0.9  PROT 7.9  ALBUMIN 4.3     Lipase  No results found for: LIPASE   Studies/Results: Ct Abdomen Pelvis W Contrast  05/19/2014   CLINICAL DATA:  Abdominal pain, periumbilical.  A onset yesterday.  EXAM: CT ABDOMEN AND PELVIS WITH CONTRAST  TECHNIQUE: Multidetector CT imaging of the abdomen and pelvis was performed using the standard protocol following bolus administration of intravenous contrast.  CONTRAST:  164mL OMNIPAQUE IOHEXOL 300 MG/ML  SOLN  COMPARISON:  None.  FINDINGS: There is a small bowel obstruction in an umbilical hernia. The abnormal small bowel dilatation extends into the hernia. The small bowel exiting the umbilical hernia is decompressed. There is no  extraluminal air. There is no ascites.  There are normal appearances of the liver, spleen, pancreas, adrenals and kidneys. The abdominal aorta is normal in caliber. Colon is unremarkable.  There is no significant abnormality in the lower chest. Moderate lumbar degenerative disc changes are present, with a grade 1 retrolisthesis at L4-5. There also is severe lower lumbar facet arthritis from L4 through the sacrum.  IMPRESSION: *Small bowel obstruction within an umbilical hernia. *Incidentally noted lumbar degenerative disc and facet changes with grade 1 retrolisthesis at L4-5.   Electronically Signed   By: Andreas Newport M.D.   On: 05/19/2014 06:24    Medications: . antiseptic oral rinse  7 mL Mouth Rinse q12n4p  . cefoTEtan (CEFOTAN) IV  2 g Intravenous Once  . chlorhexidine  15 mL Mouth Rinse BID  . enoxaparin (LOVENOX) injection  40 mg Subcutaneous Q24H    Assessment/Plan Strangulate umbilical hernia repair with small bowel, necrotic bowel DIAGNOSTIC LAPAROSCOPY, SMALL BOWEL RESECTION WITH PRIMARY INCARCERATED UMBILICAL HERNIA REPAIR, 05/19/14, Dr. Ralene Ok Day 2 of Cefotetan  DVT:  SCD/Lovenox   Plan:  Remove non functioning NG, and replace, pull foley and start to mobilize.  Continue antibiotics    LOS: 1 day    Ernest [name] 05/20/2014

## 2014-05-21 LAB — BASIC METABOLIC PANEL
ANION GAP: 1 — AB (ref 5–15)
BUN: 14 mg/dL (ref 6–20)
CO2: 30 mmol/L (ref 22–32)
Calcium: 7.6 mg/dL — ABNORMAL LOW (ref 8.9–10.3)
Chloride: 110 mmol/L (ref 101–111)
Creatinine, Ser: 0.7 mg/dL (ref 0.61–1.24)
GFR calc Af Amer: >60 mL/min (ref >60)
GLUCOSE: 134 mg/dL — AB (ref 70–99)
POTASSIUM: 3.4 mmol/L — AB (ref 3.5–5.1)
Sodium: 141 mmol/L (ref 135–145)

## 2014-05-21 LAB — CBC
HCT: 27.6 % — ABNORMAL LOW (ref 39.0–52.0)
Hemoglobin: 9.1 g/dL — ABNORMAL LOW (ref 13.0–17.0)
MCH: 30.8 pg (ref 26.0–34.0)
MCHC: 33 g/dL (ref 30.0–36.0)
MCV: 93.6 fL (ref 78.0–100.0)
PLATELETS: 204 10*3/uL (ref 150–400)
RBC: 2.95 MIL/uL — AB (ref 4.22–5.81)
RDW: 13 % (ref 11.5–15.5)
WBC: 10.9 10*3/uL — AB (ref 4.0–10.5)

## 2014-05-21 MED ORDER — HYDROCODONE-ACETAMINOPHEN 5-325 MG PO TABS
1.0000 | ORAL_TABLET | ORAL | Status: DC | PRN
  Administered 2014-05-21 – 2014-05-23 (×5): 1 via ORAL
  Filled 2014-05-21: qty 1
  Filled 2014-05-21: qty 2
  Filled 2014-05-21 (×3): qty 1

## 2014-05-21 MED ORDER — ACETAMINOPHEN 325 MG PO TABS: 325.0000 mg | ORAL_TABLET | Freq: Four times a day (QID) | ORAL | Status: DC | PRN

## 2014-05-21 NOTE — Progress Notes (Signed)
Patient ID: Ernest [name], male   DOB: November 12, 1955, 59 y.o.   MRN: [id number]     [facility]      [address] [place], [place] [address]    Phone: [phone] FAX: [phone]     Subjective: Had a BM.  VSS.  Afebrile.   WBC 10.9k.  Walking.  Sore.   Objective:  Vital signs:  Filed Vitals:   05/20/14 2040 05/20/14 2041 05/20/14 2100 05/21/14 0615  BP:   114/75 113/58  Pulse:   98 97  Temp:   98.8 F (37.1 C) 97.5 F (36.4 C)  TempSrc:   Oral Oral  Resp:   16 16  Height:      Weight:      SpO2: 85% 93% 97% 98%    Last BM Date: 05/21/14  Intake/Output   Yesterday:  05/05 0701 - 05/06 0700 In: 2466.7 [I.V.:2366.7; IV Piggyback:100] Out: 2197 [JOITG:5498; Emesis/NG output:40] This shift:  Total I/O In: 400 [I.V.:400] Out: 10 [Emesis/NG output:10]   Physical Exam: General: Pt awake/alert/oriented x4 in no acute distress Chest: cta.  No chest wall pain w good excursion CV:  Pulses intact.  Regular rhythm MS: Normal AROM mjr joints.  No obvious deformity Abdomen: Soft.  Nondistended.  Umbilical wound is cleaned, repacked.   No evidence of peritonitis.  No incarcerated hernias.appropriately tender.  Ext:  SCDs BLE.  No mjr edema.  No cyanosis Skin: No petechiae / purpura   Problem List:   Active Problems:   Incarcerated umbilical hernia    Results:   Labs: Results for orders placed or performed during the hospital encounter of 05/19/14 (from the past 48 hour(s))  CBC     Status: Abnormal   Collection Time: 05/21/14  5:12 AM  Result Value Ref Range   WBC 10.9 (H) 4.0 - 10.5 K/uL   RBC 2.95 (L) 4.22 - 5.81 MIL/uL   Hemoglobin 9.1 (L) 13.0 - 17.0 g/dL   HCT 27.6 (L) 39.0 - 52.0 %   MCV 93.6 78.0 - 100.0 fL   MCH 30.8 26.0 - 34.0 pg   MCHC 33.0 30.0 - 36.0 g/dL   RDW 13.0 11.5 - 15.5 %   Platelets 204 150 - 400 K/uL  Basic metabolic panel     Status: Abnormal   Collection Time: 05/21/14  5:12 AM  Result  Value Ref Range   Sodium 141 135 - 145 mmol/L    Comment: REPEATED TO VERIFY   Potassium 3.4 (L) 3.5 - 5.1 mmol/L    Comment: REPEATED TO VERIFY   Chloride 110 101 - 111 mmol/L    Comment: REPEATED TO VERIFY   CO2 30 22 - 32 mmol/L    Comment: REPEATED TO VERIFY   Glucose, Bld 134 (H) 70 - 99 mg/dL   BUN 14 6 - 20 mg/dL   Creatinine, Ser 0.70 0.61 - 1.24 mg/dL   Calcium 7.6 (L) 8.9 - 10.3 mg/dL    Comment: REPEATED TO VERIFY   GFR calc non Af Amer >60 >60 mL/min   GFR calc Af Amer >60 >60 mL/min    Comment: (NOTE) The eGFR has been calculated using the CKD EPI equation. This calculation has not been validated in all clinical situations. eGFR's persistently <60 mL/min signify possible Chronic Kidney Disease.    Anion gap 1 (L) 5 - 15    Comment: REPEATED TO VERIFY    Imaging / Studies: Dg Abd 1 View  05/20/2014  CLINICAL DATA:  Postoperative ileus.  Nasogastric tube placement.  EXAM: ABDOMEN - 1 VIEW  COMPARISON:  None.  FINDINGS: Dilated loops of small bowel are present. Large amount of stool is present in the RIGHT colon.  Nasogastric tube is present with the tip in the gastric fundus. The stomach is collapsed.  On the more inferior film, there is no gas identified in the rectosigmoid.  IMPRESSION: 1. Nasogastric tube in the gastric fundus. 2. Mildly dilated loops of small bowel and large RIGHT-sided stool burden. In the postoperative setting, this is most compatible with ileus.   Electronically Signed   By: Dereck Ligas M.D.   On: 05/20/2014 17:04    Medications / Allergies:  Scheduled Meds: . antiseptic oral rinse  7 mL Mouth Rinse q12n4p  . cefoTEtan (CEFOTAN) IV  2 g Intravenous Q12H  . chlorhexidine  15 mL Mouth Rinse BID  . enoxaparin (LOVENOX) injection  40 mg Subcutaneous Q24H   Continuous Infusions: . 0.9 % NaCl with KCl 20 mEq / L    . dextrose 5 % and 0.9% NaCl 100 mL/hr at 05/21/14 0546   PRN Meds:.HYDROmorphone (DILAUDID) injection, HYDROmorphone (DILAUDID)  injection, ondansetron, phenol  Antibiotics: Anti-infectives    Start     Dose/Rate Route Frequency Ordered Stop   05/20/14 2200  cefoTEtan (CEFOTAN) 2 g in dextrose 5 % 50 mL IVPB  Status:  Discontinued     2 g 100 mL/hr over 30 Minutes Intravenous  Once 05/19/14 1326 05/20/14 1218   05/20/14 1300  cefoTEtan (CEFOTAN) 2 g in dextrose 5 % 50 mL IVPB     2 g 100 mL/hr over 30 Minutes Intravenous Every 12 hours 05/20/14 1218     05/19/14 1000  cefoTEtan (CEFOTAN) 2 g in dextrose 5 % 50 mL IVPB     2 g 100 mL/hr over 30 Minutes Intravenous On call to O.R. 05/19/14 0945 05/19/14 1030        Assessment/Plan POD#2 diagnostic laparoscopic, SBR with primary incarcerated umbilical hernia repair---Dr. Rosendo Gros -BID wet to dry dressing changes to open umbilical incision ID-cefotetan D#2, ?duration FEN-DC NGT and allow for fulls, transition to PO pain meds, IVF until PO is adequate  VTE prophylaxis-SCD/lovenox  Dispo-HH ordered for help with dressing changes.   Erby Pian, [facility] Surgery Pager [phone](7A-4:30P)   05/21/2014 11:26 AM

## 2014-05-22 NOTE — Progress Notes (Signed)
Patient ID: Ernest [name], male   DOB: December 25, 1955, 59 y.o.   MRN: [id number]     [facility]      [address] [place], [place] [address]    Phone: [phone] FAX: [phone]     Subjective: Had a BM.  VSS.  Afebrile. Walking.  Less sore.   Objective:  Vital signs:  Filed Vitals:   05/21/14 0615 05/21/14 1400 05/21/14 2154 05/22/14 0513  BP: 113/58 113/52 109/50 114/61  Pulse: 97 91 82 73  Temp: 97.5 F (36.4 C) 98.4 F (36.9 C) 98.9 F (37.2 C) 98.5 F (36.9 C)  TempSrc: Oral Oral Oral Oral  Resp: '16 15 16 16  ' Height:      Weight:      SpO2: 98% 92% 92% 95%    Last BM Date: 05/21/14  Intake/Output   Yesterday:  05/06 0701 - 05/07 0700 In: 2520 [P.O.:120; I.V.:2400] Out: 1310 [Urine:850; Emesis/NG output:460] This shift:      Physical Exam: General: Pt awake/alert/oriented x4 in no acute distress Abdomen: Soft.  Nondistended.  Umbilical wound is cleaned, repacked.   No evidence of peritonitis.  appropriately tender.  Ext:  SCDs BLE.  No mjr edema.  No cyanosis Skin: No petechiae / purpura   Problem List:   Active Problems:   Incarcerated umbilical hernia    Results:   Labs: Results for orders placed or performed during the hospital encounter of 05/19/14 (from the past 48 hour(s))  CBC     Status: Abnormal   Collection Time: 05/21/14  5:12 AM  Result Value Ref Range   WBC 10.9 (H) 4.0 - 10.5 K/uL   RBC 2.95 (L) 4.22 - 5.81 MIL/uL   Hemoglobin 9.1 (L) 13.0 - 17.0 g/dL   HCT 27.6 (L) 39.0 - 52.0 %   MCV 93.6 78.0 - 100.0 fL   MCH 30.8 26.0 - 34.0 pg   MCHC 33.0 30.0 - 36.0 g/dL   RDW 13.0 11.5 - 15.5 %   Platelets 204 150 - 400 K/uL  Basic metabolic panel     Status: Abnormal   Collection Time: 05/21/14  5:12 AM  Result Value Ref Range   Sodium 141 135 - 145 mmol/L    Comment: REPEATED TO VERIFY   Potassium 3.4 (L) 3.5 - 5.1 mmol/L    Comment: REPEATED TO VERIFY   Chloride 110 101 - 111 mmol/L     Comment: REPEATED TO VERIFY   CO2 30 22 - 32 mmol/L    Comment: REPEATED TO VERIFY   Glucose, Bld 134 (H) 70 - 99 mg/dL   BUN 14 6 - 20 mg/dL   Creatinine, Ser 0.70 0.61 - 1.24 mg/dL   Calcium 7.6 (L) 8.9 - 10.3 mg/dL    Comment: REPEATED TO VERIFY   GFR calc non Af Amer >60 >60 mL/min   GFR calc Af Amer >60 >60 mL/min    Comment: (NOTE) The eGFR has been calculated using the CKD EPI equation. This calculation has not been validated in all clinical situations. eGFR's persistently <60 mL/min signify possible Chronic Kidney Disease.    Anion gap 1 (L) 5 - 15    Comment: REPEATED TO VERIFY    Imaging / Studies: Dg Abd 1 View  05/20/2014   CLINICAL DATA:  Postoperative ileus.  Nasogastric tube placement.  EXAM: ABDOMEN - 1 VIEW  COMPARISON:  None.  FINDINGS: Dilated loops of small bowel are present. Large amount of stool is  present in the RIGHT colon.  Nasogastric tube is present with the tip in the gastric fundus. The stomach is collapsed.  On the more inferior film, there is no gas identified in the rectosigmoid.  IMPRESSION: 1. Nasogastric tube in the gastric fundus. 2. Mildly dilated loops of small bowel and large RIGHT-sided stool burden. In the postoperative setting, this is most compatible with ileus.   Electronically Signed   By: Dereck Ligas M.D.   On: 05/20/2014 17:04    Medications / Allergies:  Scheduled Meds: . antiseptic oral rinse  7 mL Mouth Rinse q12n4p  . cefoTEtan (CEFOTAN) IV  2 g Intravenous Q12H  . chlorhexidine  15 mL Mouth Rinse BID  . enoxaparin (LOVENOX) injection  40 mg Subcutaneous Q24H   Continuous Infusions:   PRN Meds:.acetaminophen, HYDROcodone-acetaminophen, HYDROmorphone (DILAUDID) injection, ondansetron, phenol  Antibiotics: Anti-infectives    Start     Dose/Rate Route Frequency Ordered Stop   05/20/14 2200  cefoTEtan (CEFOTAN) 2 g in dextrose 5 % 50 mL IVPB  Status:  Discontinued     2 g 100 mL/hr over 30 Minutes Intravenous  Once  05/19/14 1326 05/20/14 1218   05/20/14 1300  cefoTEtan (CEFOTAN) 2 g in dextrose 5 % 50 mL IVPB     2 g 100 mL/hr over 30 Minutes Intravenous Every 12 hours 05/20/14 1218     05/19/14 1000  cefoTEtan (CEFOTAN) 2 g in dextrose 5 % 50 mL IVPB     2 g 100 mL/hr over 30 Minutes Intravenous On call to O.R. 05/19/14 0945 05/19/14 1030        Assessment/Plan POD#3 diagnostic laparoscopic, SBR with primary incarcerated umbilical hernia repair---Dr. Rosendo Gros -BID wet to dry dressing changes to open umbilical incision ID-cefotetan D#3, no signs of infection, wound open.  Will d/c FEN-Pt tolerating fulls, having bowel function- advance to bland diet VTE prophylaxis-SCD/lovenox  Dispo-HH ordered for help with dressing changes.  Plan for d/c in [place], MD  [facility]orectal [facility] [place] [facility]    05/22/2014 12:34 PM

## 2014-05-23 MED ORDER — HYDROCODONE-ACETAMINOPHEN 5-325 MG PO TABS: 1.0000 | ORAL_TABLET | ORAL | Status: DC | PRN

## 2014-05-23 NOTE — Progress Notes (Signed)
4 Days Post-Op  Subjective: No complaints  Objective: Vital signs in last 24 hours: Temp:  [98 F (36.7 C)-98.7 F (37.1 C)] 98.7 F (37.1 C) (05/08 0500) Pulse Rate:  [78-81] 79 (05/08 0500) Resp:  [16] 16 (05/08 0500) BP: (118-124)/(62-78) 118/71 mmHg (05/08 0500) SpO2:  [95 %-98 %] 95 % (05/08 0500) Last BM Date: 05/21/14  Intake/Output from previous day: 05/07 0701 - 05/08 0700 In: 435 [I.V.:435] Out: 600 [Urine:600] Intake/Output this shift:    Resp: clear to auscultation bilaterally Cardio: regular rate and rhythm GI: soft, nontender. open wound is clean. having bm'[name]  Lab Results:   Recent Labs  05/21/14 0512  WBC 10.9*  HGB 9.1*  HCT 27.6*  PLT 204   BMET  Recent Labs  05/21/14 0512  NA 141  K 3.4*  CL 110  CO2 30  GLUCOSE 134*  BUN 14  CREATININE 0.70  CALCIUM 7.6*   PT/INR No results for input([name]): LABPROT, INR in the last 72 hours. ABG No results for input([name]): PHART, HCO3 in the last 72 hours.  Invalid input([name]): PCO2, PO2  Studies/Results: No results found.  Anti-infectives: Anti-infectives    Start     Dose/Rate Route Frequency Ordered Stop   05/20/14 2200  cefoTEtan (CEFOTAN) 2 g in dextrose 5 % 50 mL IVPB  Status:  Discontinued     2 g 100 mL/hr over 30 Minutes Intravenous  Once 05/19/14 1326 05/20/14 1218   05/20/14 1300  cefoTEtan (CEFOTAN) 2 g in dextrose 5 % 50 mL IVPB  Status:  Discontinued     2 g 100 mL/hr over 30 Minutes Intravenous Every 12 hours 05/20/14 1218 05/22/14 1237   05/19/14 1000  cefoTEtan (CEFOTAN) 2 g in dextrose 5 % 50 mL IVPB     2 g 100 mL/hr over 30 Minutes Intravenous On call to O.R. 05/19/14 0945 05/19/14 1030      Assessment/Plan: [name]/p Procedure([name]): DIAGNOSTIC LAPAROSCOPY, SMALL BOWEL RESECTION WITH PRIMARY INCARCERATED UMBILICAL HERNIA REPAIR (N/A) Advance diet Discharge  LOS: 4 days    Ernest [name],Ernest [name] 05/23/2014

## 2014-05-23 NOTE — Progress Notes (Signed)
Referral received to set up [facility] RN for dsg. Pt dc home prior to arranging [place]. Per Unit RN, she did show pt's wife how to do dressing changes prior to dc. Wife communicated to Unit RN that she did not want [facility] RN. Attempted call to speak to verify, left message on voice mail. Jonnie Finner RN CCM Case Mgmt phone [phone]

## 2014-05-23 NOTE — Progress Notes (Signed)
Wife able to demonstrate wet to dry dressing change  Correctly.

## 2014-05-25 NOTE — Care Management Note (Signed)
Case Management Note  Patient Details  Name: Ernest [name] MRN: [id number] Date of Birth: 12/10/1955  Subjective/Objective:                    Action/Plan: Lives at home with wife Ernest [name]  Expected Discharge Date:   (unknown)               Expected Discharge Plan:  [place]  In-House Referral:     Discharge planning Services  CM Consult  Post Acute Care Choice:  Home Health Choice offered to:  Spouse  DME Arranged:    DME Agency:     HH Arranged:  RN [place] Agency:  [place]  Status of Service:  Completed, signed off  Medicare Important Message Given:  No Date Medicare IM Given:    Medicare IM give by:    Date Additional Medicare IM Given:    Additional Medicare Important Message give by:     If discussed at [place] of Stay Meetings, dates discussed:    Additional Comments: NCM was able to reach pt and speak to his wife, Ernest [name]. States they would like [facility] RN to come out to look at surgery site.  Offered choice for [facility]. Wife agreeable to [facility] for [facility] RN. Contacted [facility] rep for [facility] RN. Jonnie Finner RN CCM Case Mgmt phone [phone] Erenest Rasher, RN 05/25/2014, 3:31 PM

## 2014-05-26 NOTE — Discharge Summary (Signed)
Physician Discharge Summary  Patient ID: Ernest [name] MRN: [id number] DOB/AGE: 59/26/1957 59 y.o.  Admit date: 05/19/2014 Discharge date: 05/23/2014  Admission Diagnoses:  Strangulate umbilical hernia repair with small bowel, necrotic bowel   Discharge Diagnoses:  Same  Active Problems:   Incarcerated umbilical hernia   PROCEDURES: DIAGNOSTIC LAPAROSCOPY, SMALL BOWEL RESECTION WITH PRIMARY INCARCERATED UMBILICAL HERNIA REPAIR, 05/19/14, Dr. Marchia Meiers Course:  59 y/o with onset of redness and pain umbilical area, 28/31 pain on presentation to the ED at [facility]. LAST BM yesterday. No nausea or vomiting, just pain. He had no medical issues and no surgery in the past. He was fine till yesterday. Has not eaten since about 1 PM yesterday. Work up in [place] shows he is afebrile, VSS. WBC is up 14.2 with left shift CT scan shows: Small bowel obstruction within an umbilical hernia.  Incidentally noted lumbar degenerative disc and facet changes with grade 1 retrolisthesis at L4-5. Pt was seen in the ED and taken to the OR by Dr. Rosendo Gros.  He tolerated the procedure well, and was returned to the floor.  Post op he had some ileus, but was otherwise did very well.  His diet was advanced and by his 4th post op day he was taking a soft diet and ready for discharge.    Condition on d/c:  Improved  Disposition: 06-Home-Health Care Svc  Discharge Instructions    Call MD for:  difficulty breathing, headache or visual disturbances    Complete by:  As directed      Call MD for:  extreme fatigue    Complete by:  As directed      Call MD for:  hives    Complete by:  As directed      Call MD for:  persistant dizziness or light-headedness    Complete by:  As directed      Call MD for:  persistant nausea and vomiting    Complete by:  As directed      Call MD for:  redness, tenderness, or signs of infection (pain, swelling, redness, odor or green/yellow discharge around incision site)     Complete by:  As directed      Call MD for:  severe uncontrolled pain    Complete by:  As directed      Call MD for:  temperature >100.4    Complete by:  As directed      Diet - low sodium heart healthy    Complete by:  As directed      Discharge instructions    Complete by:  As directed   Remove packing and shower daily. Wash wound gently. Repack wound and cover after shower. Diet as tolerated     Increase activity slowly    Complete by:  As directed             Medication List    TAKE these medications        diphenoxylate-atropine 2.5-0.025 MG per tablet  Commonly known as:  LOMOTIL  Take 2 tablets by mouth 4 (four) times daily as needed for diarrhea or loose stools.     fexofenadine-pseudoephedrine 180-240 MG per 24 hr tablet  Commonly known as:  ALLEGRA-D 24  Take 1 tablet by mouth daily as needed (allerigies).     HYDROcodone-acetaminophen 5-325 MG per tablet  Commonly known as:  NORCO/VICODIN  Take 1-2 tablets by mouth every 4 (four) hours as needed for moderate pain or severe pain.  Follow-up Information    Follow up with Reyes Ivan, MD In 2 weeks.   Specialty:  General Surgery   Contact information:   [address] [place] [address] [phone]       Signed: Earnstine Regal 05/26/2014, 10:22 AM

## 2014-10-06 ENCOUNTER — Other Ambulatory Visit: Payer: Self-pay | Admitting: Gastroenterology

## 2014-12-06 ENCOUNTER — Encounter (HOSPITAL_COMMUNITY): Payer: Self-pay | Admitting: *Deleted

## 2014-12-16 NOTE — Anesthesia Preprocedure Evaluation (Addendum)
Anesthesia Evaluation  Patient identified by MRN, date of birth, ID band Patient awake    Reviewed: Allergy & Precautions, NPO status , Patient's Chart, lab work & pertinent test results  Airway Mallampati: II   Neck ROM: Full    Dental  (+) Dental Advisory Given, Teeth Intact   Pulmonary neg pulmonary ROS,    breath sounds clear to auscultation       Cardiovascular negative cardio ROS   Rhythm:Regular     Neuro/Psych negative neurological ROS  negative psych ROS   GI/Hepatic Neg liver ROS, SP small bowl resection 2nd to incarcerated [facility]   Endo/Other  negative endocrine ROS  Renal/GU negative Renal ROS  negative genitourinary   Musculoskeletal negative musculoskeletal ROS (+)   Abdominal (+)  Abdomen: soft.    Peds negative pediatric ROS (+)  Hematology   Anesthesia Other Findings   Reproductive/Obstetrics negative OB ROS                            Anesthesia Physical Anesthesia Plan  ASA: II  Anesthesia Plan: MAC   Post-op Pain Management:    Induction: Intravenous  Airway Management Planned: Nasal Cannula  Additional Equipment:   Intra-op Plan:   Post-operative Plan:   Informed Consent: I have reviewed the patients History and Physical, chart, labs and discussed the procedure including the risks, benefits and alternatives for the proposed anesthesia with the patient or authorized representative who has indicated his/her understanding and acceptance.     Plan Discussed with:   Anesthesia Plan Comments:         Anesthesia Quick Evaluation

## 2014-12-21 ENCOUNTER — Ambulatory Visit (HOSPITAL_COMMUNITY): Payer: BLUE CROSS/BLUE SHIELD | Admitting: Anesthesiology

## 2014-12-21 ENCOUNTER — Encounter (HOSPITAL_COMMUNITY): Payer: Self-pay | Admitting: *Deleted

## 2014-12-21 ENCOUNTER — Encounter (HOSPITAL_COMMUNITY)
Admission: RE | Disposition: A | Discharge status: Home / Self Care | Payer: Self-pay | Source: Ambulatory Visit | Attending: Gastroenterology

## 2014-12-21 ENCOUNTER — Ambulatory Visit (HOSPITAL_COMMUNITY)
Admission: RE | Admit: 2014-12-21 | Discharge: 2014-12-21 | Disposition: A | Discharge status: Home / Self Care | Payer: BLUE CROSS/BLUE SHIELD | Source: Ambulatory Visit | Attending: Gastroenterology | Admitting: Gastroenterology

## 2014-12-21 DIAGNOSIS — Z1211 Encounter for screening for malignant neoplasm of colon: Secondary | ICD-10-CM | POA: Insufficient documentation

## 2014-12-21 DIAGNOSIS — E7800 Pure hypercholesterolemia, unspecified: Secondary | ICD-10-CM | POA: Diagnosis not present

## 2014-12-21 DIAGNOSIS — D124 Benign neoplasm of descending colon: Secondary | ICD-10-CM | POA: Insufficient documentation

## 2014-12-21 HISTORY — DX: Allergy status to unspecified drugs, medicaments and biological substances: Z88.9

## 2014-12-21 HISTORY — PX: COLONOSCOPY WITH PROPOFOL: SHX5780

## 2014-12-21 SURGERY — COLONOSCOPY WITH PROPOFOL
Anesthesia: Monitor Anesthesia Care

## 2014-12-21 MED ORDER — PROPOFOL 500 MG/50ML IV EMUL
INTRAVENOUS | Status: DC | PRN
  Administered 2014-12-21 (×3): 30 mg via INTRAVENOUS

## 2014-12-21 MED ORDER — PROPOFOL 500 MG/50ML IV EMUL
INTRAVENOUS | Status: DC | PRN
  Administered 2014-12-21: 100 ug/kg/min via INTRAVENOUS

## 2014-12-21 MED ORDER — MEPERIDINE HCL 100 MG/ML IJ SOLN: 6.2500 mg | INTRAMUSCULAR | Status: DC | PRN

## 2014-12-21 MED ORDER — FENTANYL CITRATE (PF) 100 MCG/2ML IJ SOLN: 25.0000 ug | INTRAMUSCULAR | Status: DC | PRN

## 2014-12-21 MED ORDER — PROPOFOL 10 MG/ML IV BOLUS
INTRAVENOUS | Status: AC
  Filled 2014-12-21: qty 40

## 2014-12-21 MED ORDER — PROMETHAZINE HCL 25 MG/ML IJ SOLN: 6.2500 mg | INTRAMUSCULAR | Status: DC | PRN

## 2014-12-21 MED ORDER — SODIUM CHLORIDE 0.9 % IV SOLN: INTRAVENOUS | Status: DC

## 2014-12-21 MED ORDER — LACTATED RINGERS IV SOLN
INTRAVENOUS | Status: DC
  Administered 2014-12-21: 1000 mL via INTRAVENOUS

## 2014-12-21 SURGICAL SUPPLY — 21 items

## 2014-12-21 NOTE — Anesthesia Postprocedure Evaluation (Signed)
Anesthesia Post Note  Patient: Ernest [name]  Procedure(s) Performed: Procedure(s) (LRB): COLONOSCOPY WITH PROPOFOL (N/A)  Patient location during evaluation: PACU Anesthesia Type: MAC Level of consciousness: awake and alert Pain management: pain level controlled Vital Signs Assessment: post-procedure vital signs reviewed and stable Respiratory status: spontaneous breathing, nonlabored ventilation, respiratory function stable and patient connected to nasal cannula oxygen Cardiovascular status: stable and blood pressure returned to baseline Anesthetic complications: no    Last Vitals:  Filed Vitals:   12/21/14 0707 12/21/14 0835  BP: 144/77   Pulse: 65 62  Temp: 36.6 C   Resp: 18 10    Last Pain: There were no vitals filed for this visit.               Carol Theys

## 2014-12-21 NOTE — Op Note (Signed)
Procedure: Screening colonoscopy  Endoscopist: Earle Gell  Premedication: Propofol administered by anesthesia  Procedure: The patient was placed in the left lateral decubitus position. Anal inspection and digital rectal exam were normal. The Pentax pediatric colonoscope was introduced into the rectum and advanced to the cecum. A normal-appearing appendiceal orifice was identified. A normal-appearing ileocecal valve was intubated and the terminal ileum inspected. Colonic preparation for the exam today was good. Withdrawal time was 11 minutes  Rectum. Normal. Retroflexed view of the distal rectum was normal  Sigmoid colon. Normal  Descending colon. From the distal descending colon at 55 cm from the anal verge, a 6 mm pedunculated polyp was removed with the electrocautery snare and a Resolution Endo Clip was applied to the polypectomy site to prevent bleeding.  Splenic flexure. Normal  Transverse colon. Normal  Hepatic flexure. Normal  Ascending colon. Normal  Cecum and ileocecal valve. Normal  Terminal ileum. Normal  Assessment: A small polyp was removed from the distal descending colon. Otherwise normal colonoscopy.  Recommendations: If the colon polyp returns adenomatous pathologically, schedule repeat colonoscopy in 5 years

## 2014-12-21 NOTE — Transfer of Care (Signed)
Immediate Anesthesia Transfer of Care Note  Patient: Ernest [name]  Procedure(s) Performed: Procedure(s): COLONOSCOPY WITH PROPOFOL (N/A)  Patient Location: PACU  Anesthesia Type:MAC  Level of Consciousness: sedated, patient cooperative and responds to stimulation  Airway & Oxygen Therapy: Patient Spontanous Breathing and Patient connected to face mask oxygen  Post-op Assessment: Report given to RN and Post -op Vital signs reviewed and stable  Post vital signs: Reviewed and stable  Last Vitals:  Filed Vitals:   12/21/14 0707  BP: 144/77  Pulse: 65  Temp: 36.6 C  Resp: 18    Complications: No apparent anesthesia complications

## 2014-12-21 NOTE — Discharge Instructions (Signed)

## 2014-12-21 NOTE — H&P (Signed)
  Procedure: Baseline screening colonoscopy. No family history of colon cancer.  History: The patient is a 59 year old male born [birth year]. He is scheduled to undergo his first screening colonoscopy with polypectomy to prevent colon cancer.  Medication allergies: None  Past medical history: Hypercholesterolemia. Incarcerated umbilical hernia repair in May 2016  Exam: The patient is alert and lying comfortably on the endoscopy stretcher. Abdomen is soft and nontender to palpation. Lungs are clear to auscultation. Cardiac exam reveals a regular rhythm.  Plan: Proceed with baseline screening colonoscopy

## 2014-12-22 ENCOUNTER — Encounter (HOSPITAL_COMMUNITY): Payer: Self-pay | Admitting: Gastroenterology

## 2016-09-05 IMAGING — CT CT ABD-PELV W/ CM
2 of 5 series · 16 of 46 positions shown, 18 images · IV contrast (APPLIED)
Comparison: None.

CLINICAL DATA: Abdominal pain, periumbilical.  A onset yesterday.

EXAM:
CT ABDOMEN AND PELVIS WITH CONTRAST
TECHNIQUE: Multidetector CT imaging of the abdomen and pelvis was performed
using the standard protocol following bolus administration of
intravenous contrast.
CONTRAST:  100mL OMNIPAQUE IOHEXOL 300 MG/ML  SOLN

[Series 2: abd/pelvis 5.0 b31f · axial · 0.78mm/px · z∈[+747,+1212]mm · 13 of 107 slices shown, 15 images]
[im 7/107  soft-tissue]
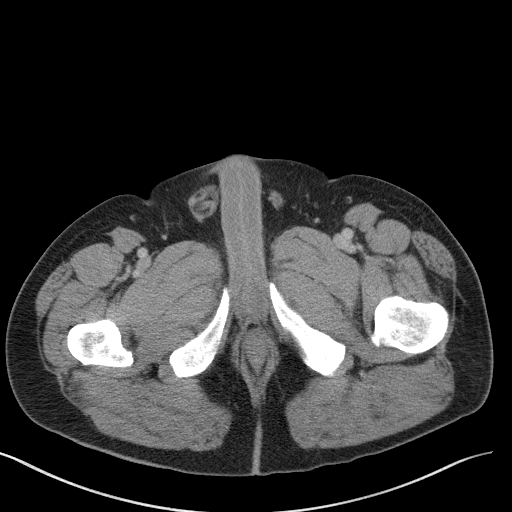
[im 7/107  bone]
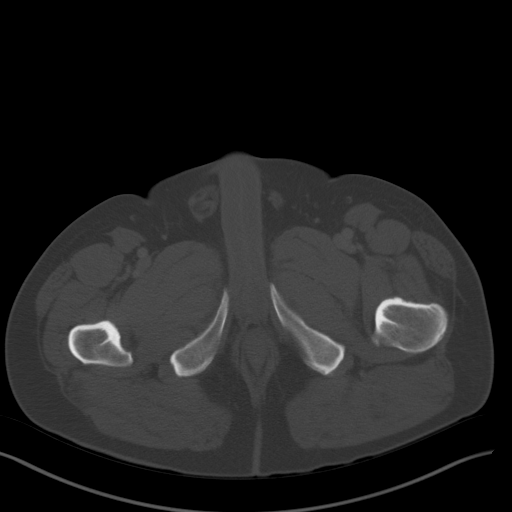
[im 13/107  soft-tissue]
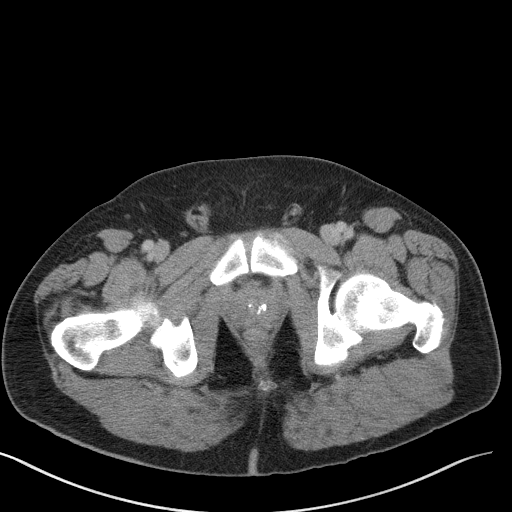
[im 25/107  soft-tissue]
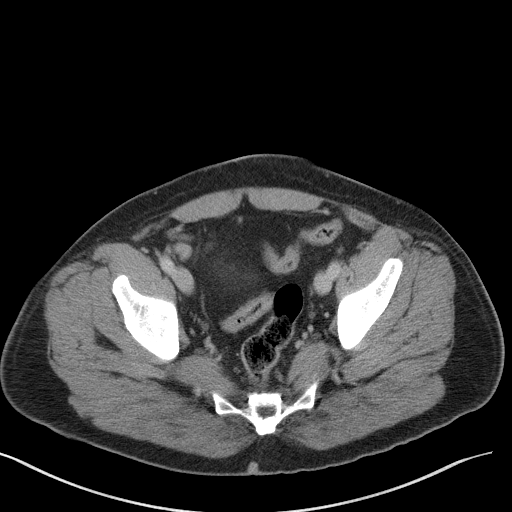
[im 32/107  soft-tissue]
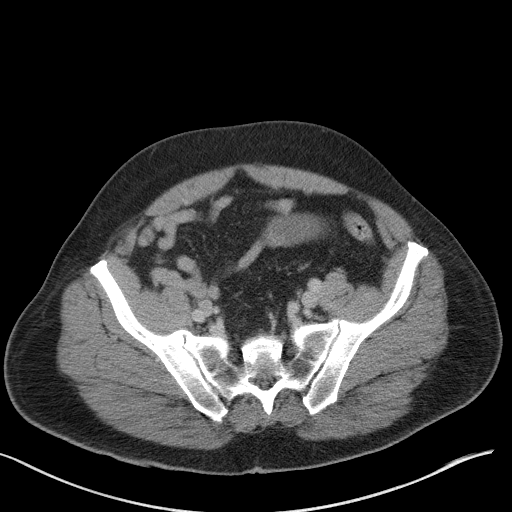
[im 38/107  soft-tissue]
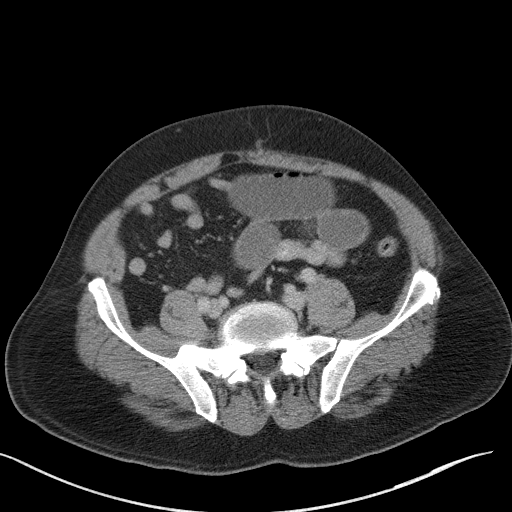
[im 44/107  soft-tissue]
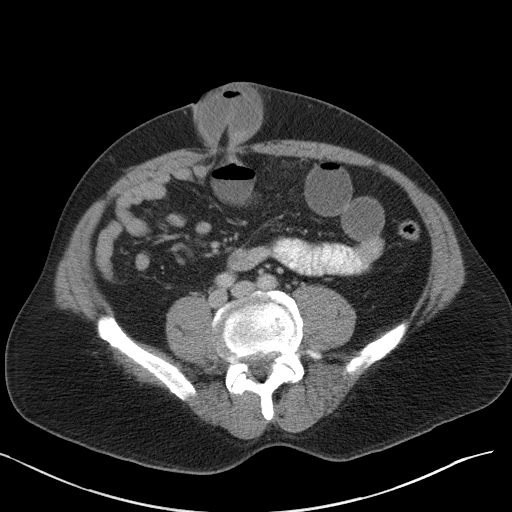
[im 57/107  soft-tissue]
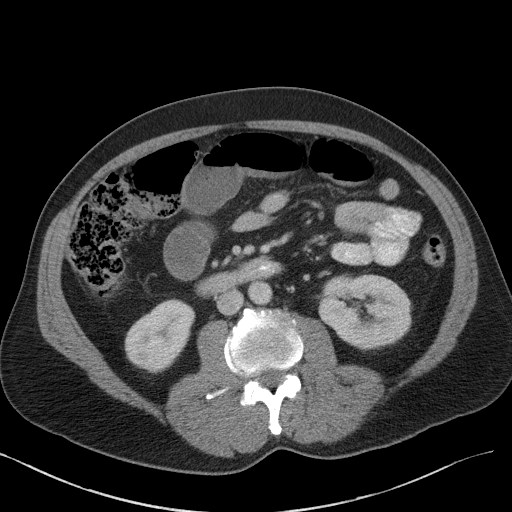
[im 63/107  soft-tissue]
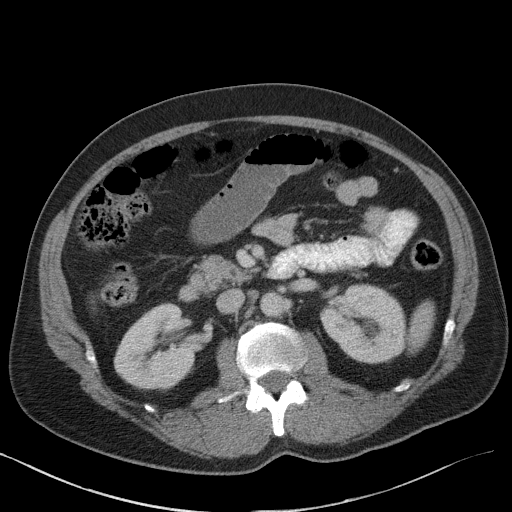
[im 69/107  soft-tissue]
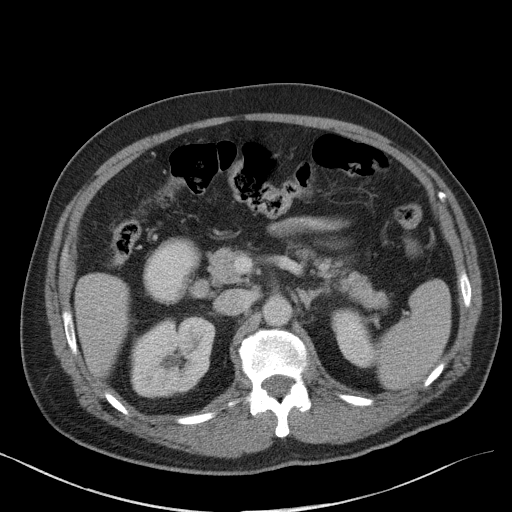
[im 69/107  bone]
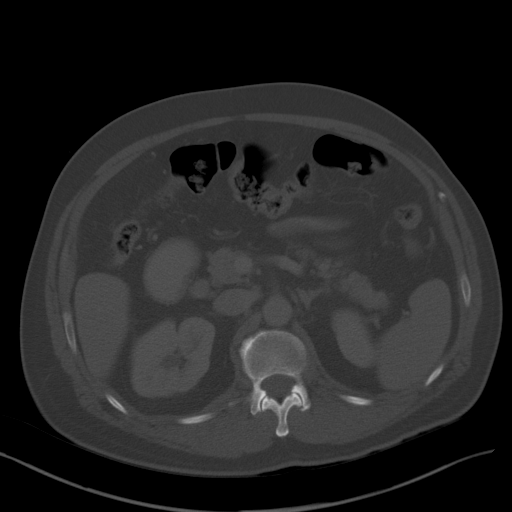
[im 75/107  soft-tissue]
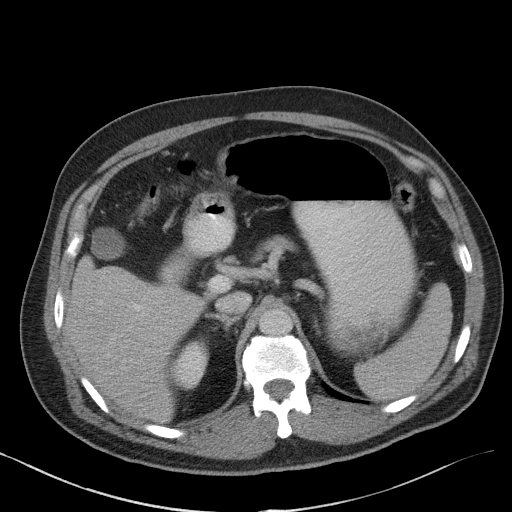
[im 82/107  soft-tissue]
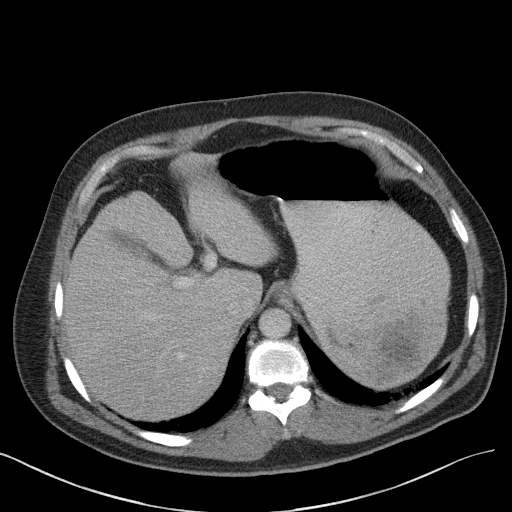
[im 94/107  soft-tissue]
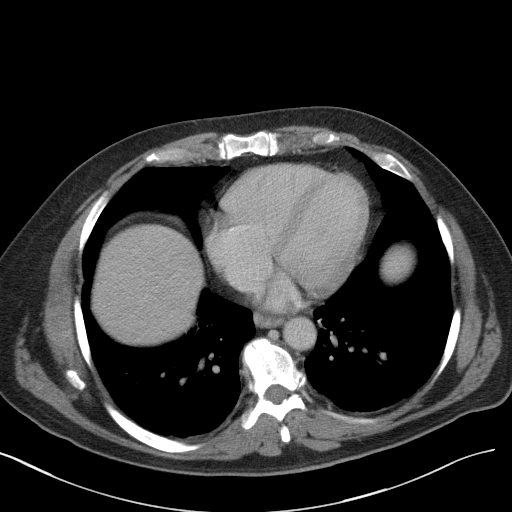
[im 100/107  soft-tissue]
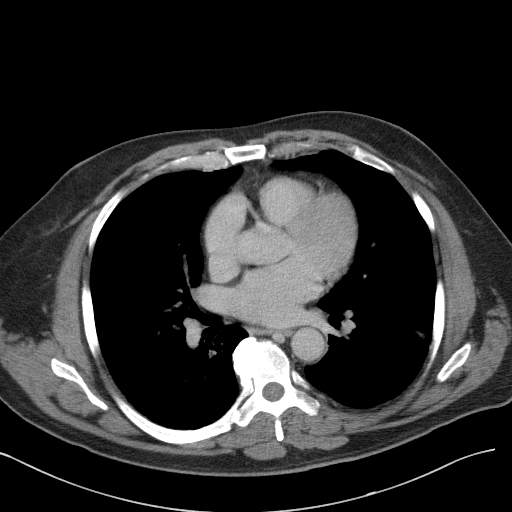

[Series 5: abd/pelvis 3.0 coronal · coronal · 0.83mm/px · 3 of 98 slices shown]
[im 33/98  soft-tissue]
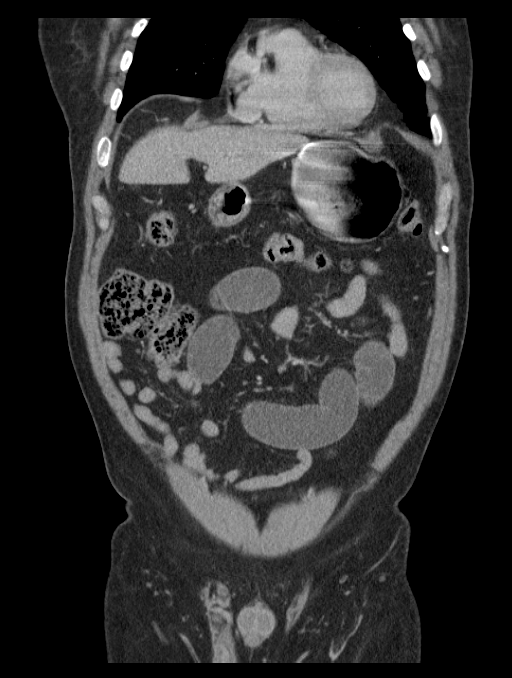
[im 44/98  soft-tissue]
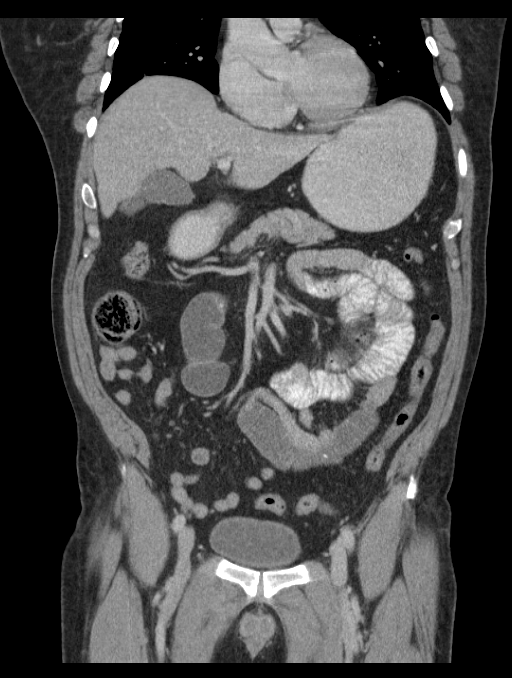
[im 54/98  soft-tissue]
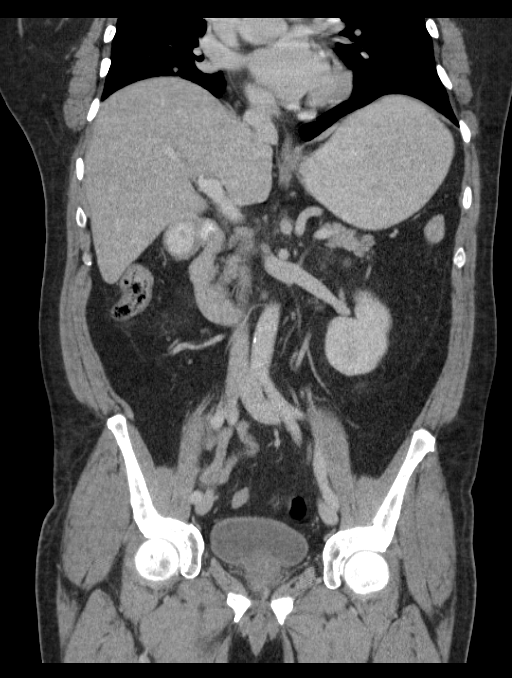

[16 of 46 positions shown; findings below may reference images not displayed]

FINDINGS: There is a small bowel obstruction in an umbilical hernia. The
abnormal small bowel dilatation extends into the hernia. The small
bowel exiting the umbilical hernia is decompressed. There is no
extraluminal air. There is no ascites.

There are normal appearances of the liver, spleen, pancreas,
adrenals and kidneys. The abdominal aorta is normal in caliber.
Colon is unremarkable.

There is no significant abnormality in the lower chest. Moderate
lumbar degenerative disc changes are present, with a grade 1
retrolisthesis at L4-5. There also is severe lower lumbar facet
arthritis from L4 through the sacrum.
IMPRESSION: *Small bowel obstruction within an umbilical hernia.
*Incidentally noted lumbar degenerative disc and facet changes with
grade 1 retrolisthesis at L4-5.

## 2016-09-06 IMAGING — DX DG ABDOMEN 1V
2 series · 2 of 2 positions shown · non-contrast
Comparison: None.

CLINICAL DATA: Postoperative ileus.  Nasogastric tube placement.

EXAM:
ABDOMEN - 1 VIEW

[abdomen kub (1 of 2)]
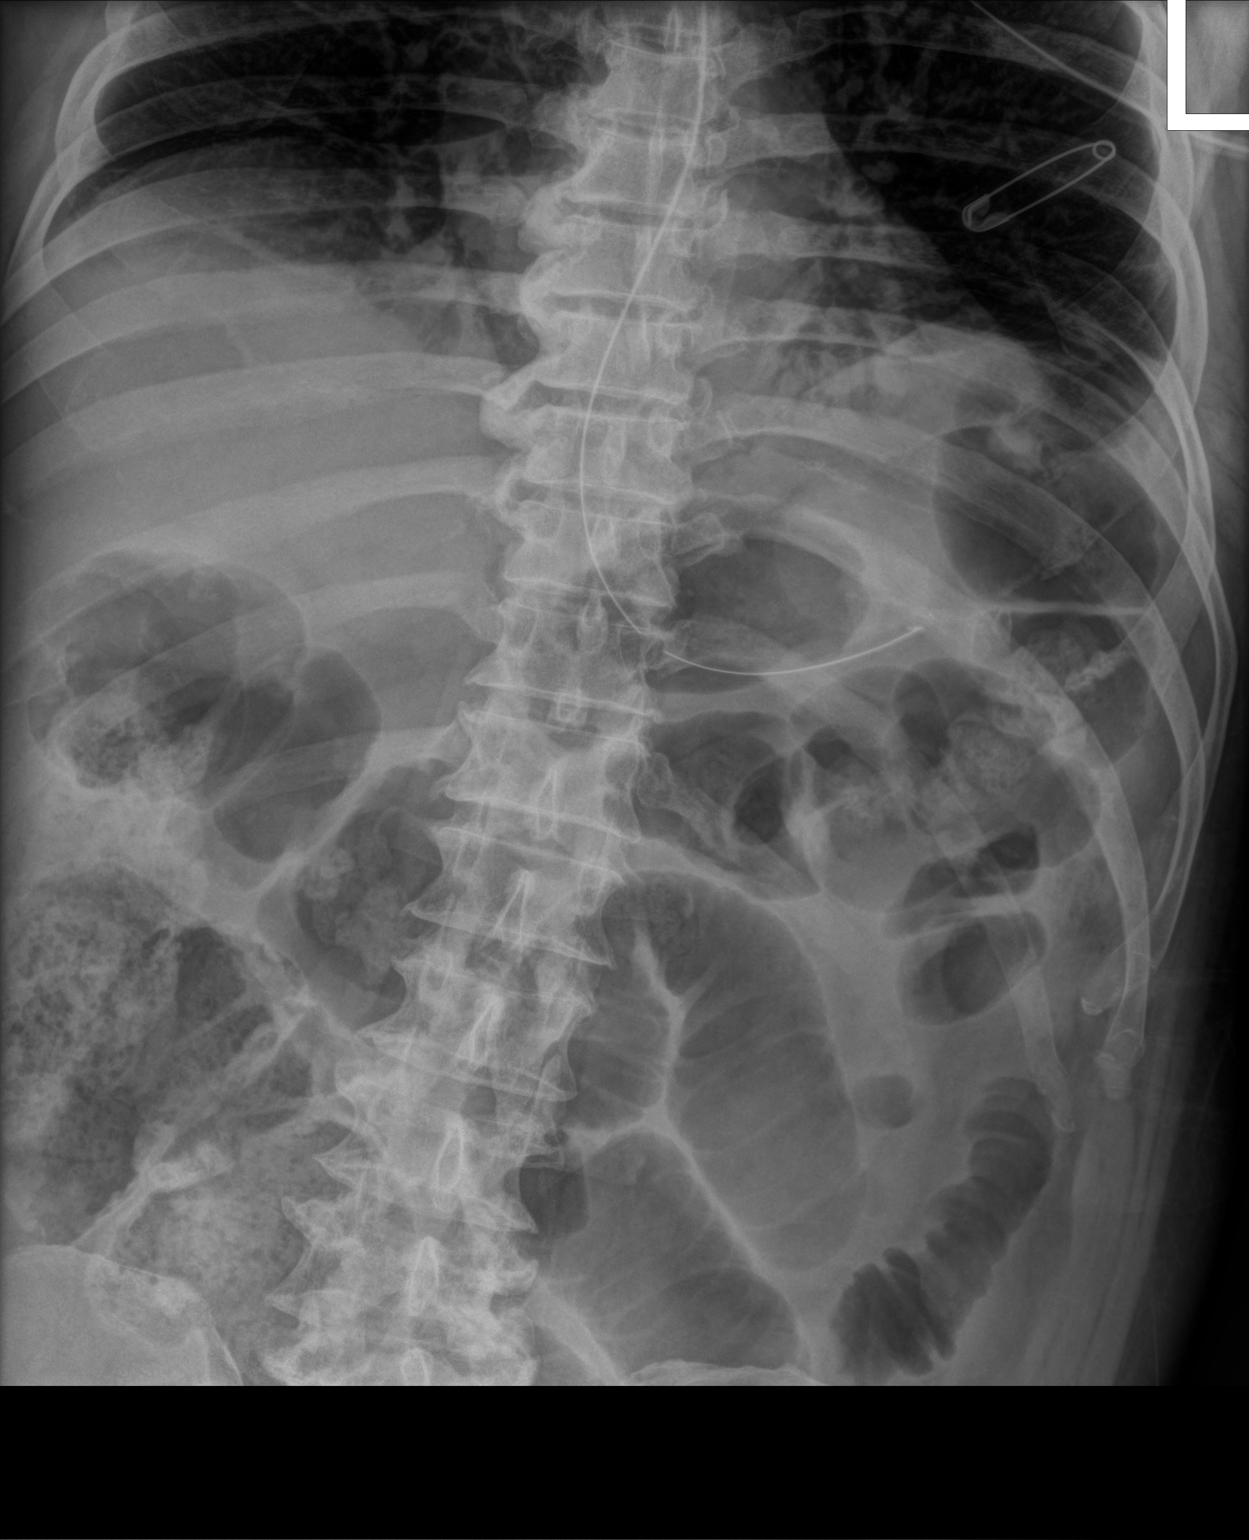

[abdomen kub (2 of 2)]
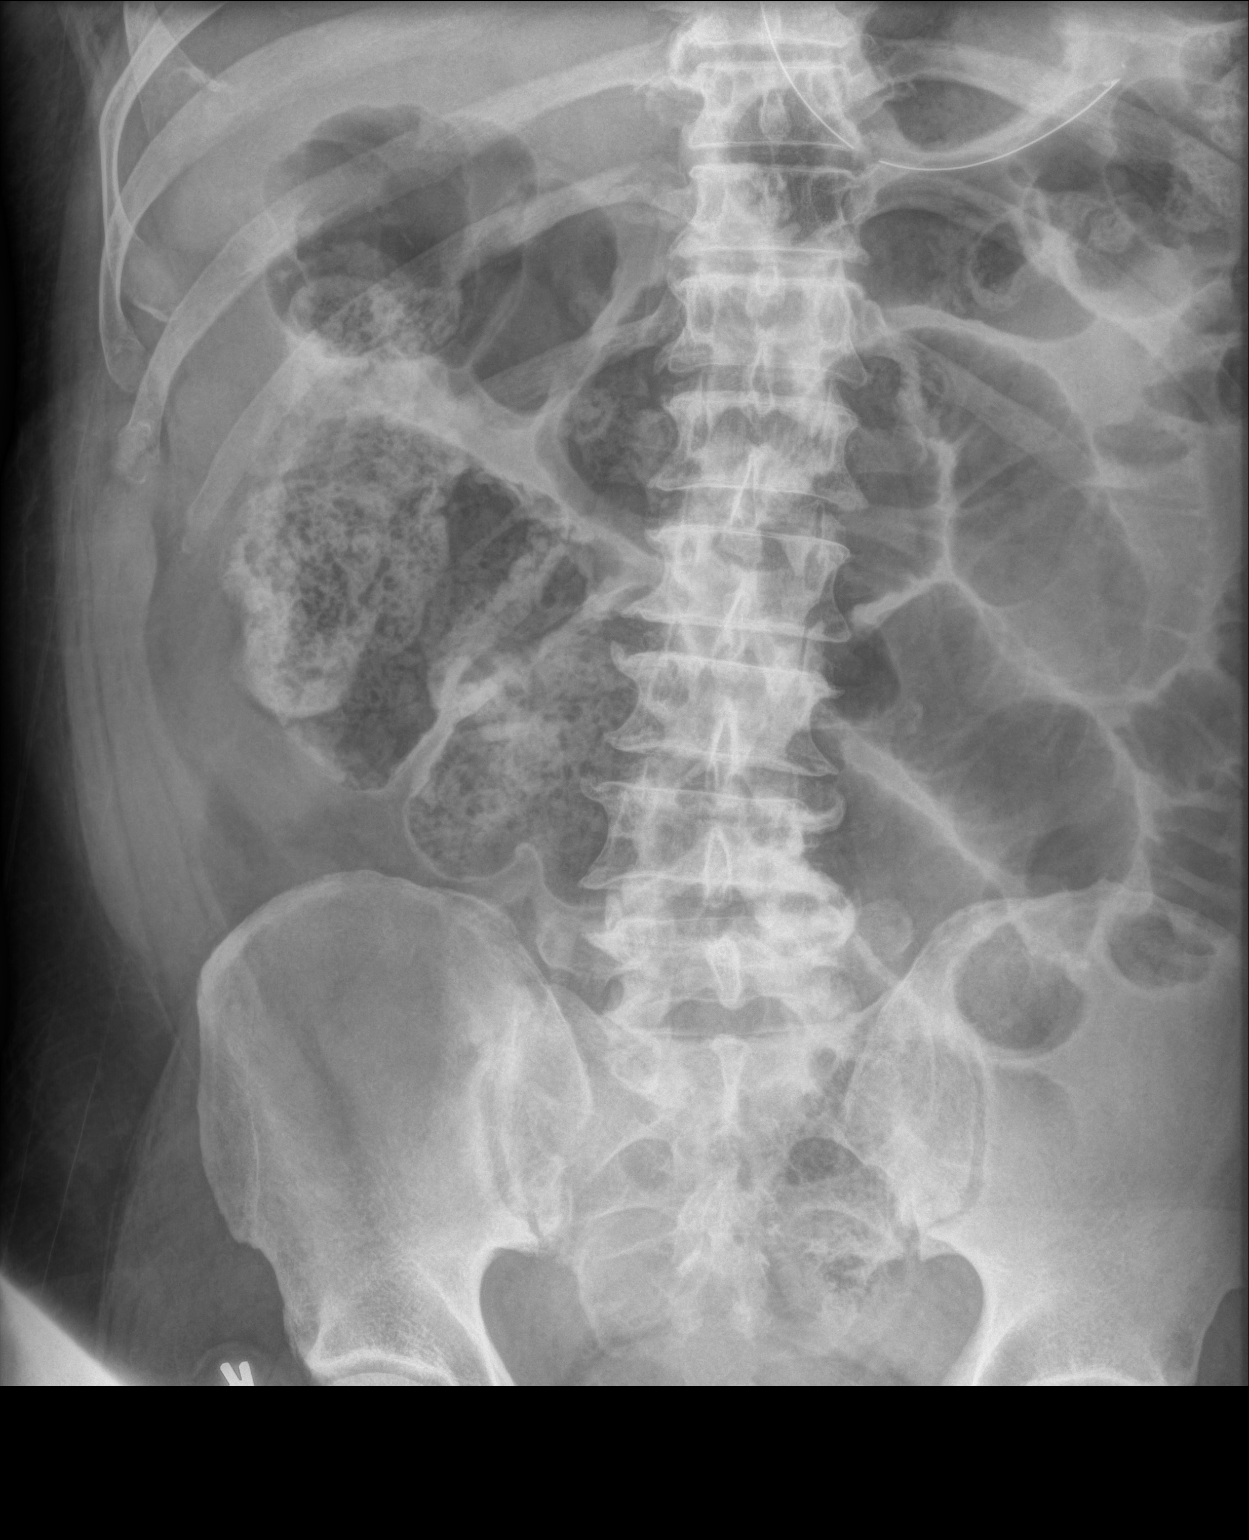

[2 of 2 positions shown; findings below may reference images not displayed]

FINDINGS: Dilated loops of small bowel are present. Large amount of stool is
present in the RIGHT colon.

Nasogastric tube is present with the tip in the gastric fundus. The
stomach is collapsed.

On the more inferior film, there is no gas identified in the
rectosigmoid.
IMPRESSION: 1. Nasogastric tube in the gastric fundus.
2. Mildly dilated loops of small bowel and large RIGHT-sided stool
burden. In the postoperative setting, this is most compatible with
ileus.

## 2024-01-07 ENCOUNTER — Emergency Department (HOSPITAL_BASED_OUTPATIENT_CLINIC_OR_DEPARTMENT_OTHER)

## 2024-01-07 ENCOUNTER — Other Ambulatory Visit: Payer: Self-pay

## 2024-01-07 ENCOUNTER — Emergency Department (HOSPITAL_BASED_OUTPATIENT_CLINIC_OR_DEPARTMENT_OTHER)
Admission: EM | Admit: 2024-01-07 | Discharge: 2024-01-07 | Disposition: A | Discharge status: Home / Self Care | Attending: Emergency Medicine | Admitting: Emergency Medicine

## 2024-01-07 ENCOUNTER — Encounter (HOSPITAL_BASED_OUTPATIENT_CLINIC_OR_DEPARTMENT_OTHER): Payer: Self-pay

## 2024-01-07 DIAGNOSIS — M549 Dorsalgia, unspecified: Secondary | ICD-10-CM | POA: Diagnosis not present

## 2024-01-07 DIAGNOSIS — R1013 Epigastric pain: Secondary | ICD-10-CM | POA: Diagnosis present

## 2024-01-07 LAB — CBC
HCT: 42.6 % (ref 39.0–52.0)
Hemoglobin: 14.5 g/dL (ref 13.0–17.0)
MCH: 30.7 pg (ref 26.0–34.0)
MCHC: 34 g/dL (ref 30.0–36.0)
MCV: 90.1 fL (ref 80.0–100.0)
Platelets: 249 K/uL (ref 150–400)
RBC: 4.73 MIL/uL (ref 4.22–5.81)
RDW: 12.3 % (ref 11.5–15.5)
WBC: 15.7 K/uL — ABNORMAL HIGH (ref 4.0–10.5)
nRBC: 0 % (ref 0.0–0.2)

## 2024-01-07 LAB — COMPREHENSIVE METABOLIC PANEL WITH GFR
ALT: 17 U/L (ref 0–44)
AST: 13 U/L — ABNORMAL LOW (ref 15–41)
Albumin: 4 g/dL (ref 3.5–5.0)
Alkaline Phosphatase: 70 U/L (ref 38–126)
Anion gap: 11 (ref 5–15)
BUN: 14 mg/dL (ref 8–23)
CO2: 27 mmol/L (ref 22–32)
Calcium: 9 mg/dL (ref 8.9–10.3)
Chloride: 99 mmol/L (ref 98–111)
Creatinine, Ser: 0.64 mg/dL (ref 0.61–1.24)
GFR, Estimated: >60 mL/min
Glucose, Bld: 151 mg/dL — ABNORMAL HIGH (ref 70–99)
Potassium: 3.8 mmol/L (ref 3.5–5.1)
Sodium: 137 mmol/L (ref 135–145)
Total Bilirubin: 1 mg/dL (ref 0.0–1.2)
Total Protein: 7.3 g/dL (ref 6.5–8.1)

## 2024-01-07 LAB — TROPONIN T, HIGH SENSITIVITY
Troponin T High Sensitivity: <15 ng/L (ref 0–19)
Troponin T High Sensitivity: <15 ng/L (ref 0–19)

## 2024-01-07 LAB — LIPASE, BLOOD: Lipase: 12 U/L (ref 11–51)

## 2024-01-07 MED ORDER — LIDOCAINE VISCOUS HCL 2 % MT SOLN
15.0000 mL | Freq: Once | OROMUCOSAL | Status: AC
  Administered 2024-01-07: 15 mL via ORAL
  Filled 2024-01-07: qty 15

## 2024-01-07 MED ORDER — KETOROLAC TROMETHAMINE 15 MG/ML IJ SOLN
15.0000 mg | Freq: Once | INTRAMUSCULAR | Status: AC
  Administered 2024-01-07: 15 mg via INTRAVENOUS
  Filled 2024-01-07: qty 1

## 2024-01-07 MED ORDER — ALUM & MAG HYDROXIDE-SIMETH 200-200-20 MG/5ML PO SUSP
30.0000 mL | Freq: Once | ORAL | Status: AC
  Administered 2024-01-07: 30 mL via ORAL
  Filled 2024-01-07: qty 30

## 2024-01-07 MED ORDER — PANTOPRAZOLE SODIUM 40 MG PO TBEC
40.0000 mg | DELAYED_RELEASE_TABLET | Freq: Once | ORAL | Status: AC
  Administered 2024-01-07: 40 mg via ORAL
  Filled 2024-01-07: qty 1

## 2024-01-07 MED ORDER — PANTOPRAZOLE SODIUM 20 MG PO TBEC: 20.0000 mg | DELAYED_RELEASE_TABLET | Freq: Every day | ORAL | 0 refills | Status: AC

## 2024-01-07 MED ORDER — PANTOPRAZOLE SODIUM 40 MG IV SOLR: 40.0000 mg | Freq: Once | INTRAVENOUS | Status: DC

## 2024-01-07 NOTE — ED Provider Notes (Signed)
 " [place] [facility] Provider Note   CSN: [id number] Arrival date & time: 01/07/24  9095     Patient presents with: Chest Pain   Ernest [name] is a 68 y.o. male.  Past medical history significant for umbilical hernia repair presents today for epigastric/chest pain that began this morning around 2 to 3 AM.  Patient's wife endorses similar episode a few days ago.  Patient denies pain radiation, nausea, vomiting, fever, chills, shortness of breath, diarrhea, or cardiac history.    Chest Pain Associated symptoms: abdominal pain        Prior to Admission medications  Medication Sig Start Date End Date Taking? Authorizing Provider  pantoprazole  (PROTONIX ) 20 MG tablet Take 1 tablet (20 mg total) by mouth daily. 01/07/24  Yes Francis Ileana SAILOR, PA-C  diphenoxylate-atropine (LOMOTIL) 2.5-0.025 MG per tablet Take 2 tablets by mouth 4 (four) times daily as needed for diarrhea or loose stools.    [provider]  fexofenadine-pseudoephedrine (ALLEGRA-D 24) 180-240 MG per 24 hr tablet Take 1 tablet by mouth daily as needed (allerigies).    [provider]    Allergies: Patient has no known allergies.    Review of Systems  Cardiovascular:  Positive for chest pain.  Gastrointestinal:  Positive for abdominal pain.    Updated Vital Signs BP 133/78   Pulse 78   Temp 97.6 F (36.4 C) (Oral)   Resp 17   Ht 5' 7 (1.702 m)   Wt 86.2 kg   SpO2 95%   BMI 29.76 kg/m   Physical Exam Vitals and nursing note reviewed.  Constitutional:      General: He is not in acute distress.    Appearance: He is well-developed. He is not toxic-appearing.  HENT:     Head: Normocephalic and atraumatic.  Eyes:     Conjunctiva/sclera: Conjunctivae normal.  Cardiovascular:     Rate and Rhythm: Normal rate and regular rhythm.     Heart sounds: Normal heart sounds. No murmur heard. Pulmonary:     Effort: Pulmonary effort is normal. No respiratory  distress.     Breath sounds: Normal breath sounds.  Abdominal:     General: There is no distension.     Palpations: Abdomen is soft.     Tenderness: There is no abdominal tenderness. There is no guarding.  Musculoskeletal:        General: No swelling.     Cervical back: Neck supple.     Right lower leg: No edema.     Left lower leg: No edema.  Skin:    General: Skin is warm and dry.     Capillary Refill: Capillary refill takes less than 2 seconds.  Neurological:     General: No focal deficit present.     Mental Status: He is alert and oriented to person, place, and time.  Psychiatric:        Mood and Affect: Mood normal.     (all labs ordered are listed, but only abnormal results are displayed) Labs Reviewed  CBC - Abnormal; Notable for the following components:      Result Value   WBC 15.7 (*)    All other components within normal limits  COMPREHENSIVE METABOLIC PANEL WITH GFR - Abnormal; Notable for the following components:   Glucose, Bld 151 (*)    AST 13 (*)    All other components within normal limits  LIPASE, BLOOD  TROPONIN T, HIGH SENSITIVITY  TROPONIN T, HIGH  SENSITIVITY    EKG: EKG Interpretation Date/Time:  Tuesday January 07 2024 09:13:27 EST Ventricular Rate:  84 PR Interval:  148 QRS Duration:  94 QT Interval:  386 QTC Calculation: 457 R Axis:   51  Text Interpretation: Sinus rhythm Borderline T abnormalities, inferior leads Baseline wander TECHNICALLY DIFFICULT Otherwise no significant change Confirmed by Emil Share [phone]) on 01/07/2024 9:14:45 AM  Radiology: [facility] Lumbar Spine Complete Result Date: 01/07/2024 CLINICAL DATA:  Low back pain.  Fall approximately 1 week ago. EXAM: LUMBAR SPINE - COMPLETE 4+ VIEW COMPARISON:  None Available. FINDINGS: 5 nonrib bearing lumbar-type vertebral bodies. Vertebral body heights are maintained.No static listhesis. Multilevel degenerative disc changes with disc height loss most pronounced at L4-L5 and endplate  osteophytosis. Age indeterminate fracture of an anterior superior endplate osteophyte at L2. Facet arthropathy of the lower lumbar spine. SI joints are anatomically aligned. IMPRESSION: 1. Age indeterminate fracture of an anterior superior endplate osteophyte at L2. 2. Multilevel degenerative disc changes of the lumbar spine, most pronounced at L4-L5. 3. Vertebral body heights are maintained. No evidence of static listhesis. Electronically Signed   By: Harrietta Sherry M.D.   On: 01/07/2024 12:13   DG Chest Port 1 View Result Date: 01/07/2024 CLINICAL DATA:  Chest and epigastric abdominal pain x2 days. EXAM: PORTABLE CHEST 1 VIEW COMPARISON:  None Available. FINDINGS: The heart size and mediastinal contours are within normal limits. There is mild to moderate severity calcification of the aortic arch. Low lung volumes are noted. Both lungs are clear. Multilevel degenerative changes are seen throughout the thoracic spine. IMPRESSION: Low lung volumes without active cardiopulmonary disease. Electronically Signed   By: Suzen Dials M.D.   On: 01/07/2024 10:52     Procedures   Medications Ordered in the ED  pantoprazole  (PROTONIX ) injection 40 mg (has no administration in time range)  alum & mag hydroxide-simeth (MAALOX/MYLANTA) 200-200-20 MG/5ML suspension 30 mL (30 mLs Oral Given 01/07/24 1034)    And  lidocaine  (XYLOCAINE ) 2 % viscous mouth solution 15 mL (15 mLs Oral Given 01/07/24 1034)  ketorolac  (TORADOL ) 15 MG/ML injection 15 mg (15 mg Intravenous Given 01/07/24 1305)                                    Medical Decision Making Amount and/or Complexity of Data Reviewed Labs: ordered. Radiology: ordered.  Risk OTC drugs. Prescription drug management.   This patient presents to the ED for concern of chest/epigastric pain, this involves an extensive number of treatment options, and is a complaint that carries with it a high risk of complications and morbidity.  The differential  diagnosis includes STEMI, NSTEMI, GERD, arrhythmia, anemia, electrolyte abnormality, gastric ulcer, pancreatitis, choledocholithiasis, acute cholecystitis   Additional history obtained:  Additional history obtained from EMR External records from outside source obtained and reviewed including general surgery notes   Lab Tests:  I Ordered, and personally interpreted labs.  The pertinent results include: Leukocytosis at 15.7, delta troponin less than 15, CMP with mildly reduced AST at 13, lipase 12   Imaging Studies ordered:  I ordered imaging studies including chest x-ray I independently visualized and interpreted imaging which showed low lung volumes without active cardiopulmonary disease I agree with the radiologist interpretation Lumbar spine x-ray: Age-indeterminate fracture of an anterior superior endplate osteophyte at L2.  Multilevel degenerative disc changes of the lumbar spine, most pronounced at L4-L5.  Vertebral body heights are maintained.  No evidence of static lithiasis.   Cardiac Monitoring: / EKG:  The patient was maintained on a cardiac monitor.  I personally viewed and interpreted the cardiac monitored which showed an underlying rhythm of: Sinus rhythm   Problem List / ED Course / Critical interventions / Medication management I ordered medication including GI cocktail, Toradol , Protonix  I have reviewed the patients home medicines and have made adjustments as needed Patient reports he fell about a week ago and is having low back pain, lumbar x-ray ordered Patient reports his epigastric pain has improved after GI cocktail  Test / Admission - Considered:  Consider admission or further workup however patient's vital signs, physical exam, labs, and imaging are reassuring.  Patient's symptoms likely due to osteophyte fracture at L2 and acid reflux.  Patient started on Protonix  and advised to follow-up with gastroenterology for further evaluation and workup.     Final  diagnoses:  Epigastric pain  Acute back pain, unspecified back location, unspecified back pain laterality    ED Discharge Orders          Ordered    pantoprazole  (PROTONIX ) 20 MG tablet  Daily        01/07/24 1315               Francis Ileana SAILOR, PA-C 01/07/24 1316    Emil Share, DO 01/07/24 1323  "

## 2024-01-07 NOTE — ED Notes (Signed)
 ED Provider at bedside.

## 2024-01-07 NOTE — Discharge Instructions (Addendum)
 Today you were seen for epigastric pain and back pain.  You do have a small fracture at L2 which is likely nonoperative and should be managed by alternating Tylenol  and Motrin as needed for pain.  I also suspect your epigastric pain is due to GERD, you have been prescribed Protonix  to see if this helps alleviate symptoms.  Please follow-up with gastroenterology for further evaluation and workup.  Thank you for letting us  treat you today. After reviewing your labs and imaging, I feel you are safe to go home. Please follow up with your PCP in the next several days and provide them with your records from this visit. Return to the Emergency Room if pain becomes severe or symptoms worsen.

## 2024-01-07 NOTE — ED Triage Notes (Addendum)
 Pt reporting chest/ abdominal pain starting x2 days ago. Denies N/V/D. Denies SHOB, dizziness. Denies cardiac hx, hx hernia repair.
